# Patient Record
Sex: Female | Born: 1995 | Race: Black or African American | Hispanic: No | Marital: Single | Smoking: Current some day smoker
Health system: Southern US, Community
[De-identification: ages and names within clinical notes are randomized; demographics above are authoritative.]

## PROBLEM LIST (undated history)

## (undated) DIAGNOSIS — M549 Dorsalgia, unspecified: Secondary | ICD-10-CM

## (undated) HISTORY — PX: WISDOM TOOTH EXTRACTION: SHX21

---
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
## 2016-01-12 ENCOUNTER — Encounter (HOSPITAL_COMMUNITY): Payer: Self-pay | Admitting: Emergency Medicine

## 2016-01-12 ENCOUNTER — Emergency Department (HOSPITAL_COMMUNITY)
Admission: EM | Admit: 2016-01-12 | Discharge: 2016-01-13 | Disposition: A | Discharge status: Home / Self Care | Payer: Medicaid Other | Attending: Emergency Medicine | Admitting: Emergency Medicine

## 2016-01-12 DIAGNOSIS — F17200 Nicotine dependence, unspecified, uncomplicated: Secondary | ICD-10-CM | POA: Insufficient documentation

## 2016-01-12 DIAGNOSIS — K0889 Other specified disorders of teeth and supporting structures: Secondary | ICD-10-CM

## 2016-01-12 DIAGNOSIS — M545 Low back pain: Secondary | ICD-10-CM

## 2016-01-12 NOTE — ED Triage Notes (Signed)
Pt also c/o abscess to vaginal area.

## 2016-01-12 NOTE — ED Triage Notes (Signed)
Pt c/o back pain, rt hip pain and dental pain.

## 2016-01-13 MED ORDER — ACETAMINOPHEN 325 MG PO TABS
650.0000 mg | ORAL_TABLET | Freq: Once | ORAL | Status: AC
  Administered 2016-01-13: 650 mg via ORAL
  Filled 2016-01-13: qty 2

## 2016-01-13 MED ORDER — ONDANSETRON HCL 4 MG PO TABS
4.0000 mg | ORAL_TABLET | Freq: Once | ORAL | Status: AC
  Administered 2016-01-13: 4 mg via ORAL
  Filled 2016-01-13: qty 1

## 2016-01-13 MED ORDER — IBUPROFEN 600 MG PO TABS: 600.0000 mg | ORAL_TABLET | Freq: Four times a day (QID) | ORAL | 0 refills | Status: DC | PRN

## 2016-01-13 MED ORDER — METHOCARBAMOL 500 MG PO TABS: 500.0000 mg | ORAL_TABLET | Freq: Three times a day (TID) | ORAL | 0 refills | Status: DC

## 2016-01-13 MED ORDER — IBUPROFEN 800 MG PO TABS
800.0000 mg | ORAL_TABLET | Freq: Once | ORAL | Status: AC
  Administered 2016-01-13: 800 mg via ORAL
  Filled 2016-01-13: qty 1

## 2016-01-13 MED ORDER — AMOXICILLIN 500 MG PO CAPS: 500.0000 mg | ORAL_CAPSULE | Freq: Three times a day (TID) | ORAL | 0 refills | Status: DC

## 2016-01-13 MED ORDER — AMOXICILLIN 250 MG PO CAPS
500.0000 mg | ORAL_CAPSULE | Freq: Once | ORAL | Status: AC
  Administered 2016-01-13: 500 mg via ORAL
  Filled 2016-01-13: qty 2

## 2016-01-13 NOTE — Discharge Instructions (Signed)
Your vital signs within normal limits. Please use Amoxil 3 times daily. Please use ibuprofen 3 times daily to assist with pain of your tooth and your back. Use Robaxin for spasm type pain concerning your back. Robaxin may cause drowsiness, please do not drive, drink alcohol, operate machinery, or participate in activities requiring concentration when taking this medication. Please see Dr. Sudie Bailey as soon as possible for additional evaluation concerning your back. Please see your dentist as soon as possible concerning the left upper wisdom tooth.

## 2016-01-13 NOTE — ED Notes (Signed)
MD at bedside. 

## 2016-01-13 NOTE — ED Provider Notes (Signed)
[facility] Provider Note   CSN: [id number] Arrival date & time: 01/12/16  2309  First Provider Contact:  First MD Initiated Contact with Patient 01/13/16 0115        History   Chief Complaint Chief Complaint  Patient presents with  . Back Pain    HPI Brandy Chang is a 20 y.o. female.  Patient is an 20 year old female who presents to the emergency department with a complaint of back pain and tooth pain.  The patient states that over the last 3-4 days she's been having increasing problems with her lower back. She denies any recent fall or injury. She denies any excessive lifting pushing or pulling on. She denies any pain with urination. There's been no blood in her urine or blood in her stool. She states the pain is not related to her menstrual cycle. His been no fever, chills, or sweats on. She denies any unusual vaginal discharge or vaginal bleeding.  The patient also complains of tooth pain. She states she can feel a tooth coming through her gum on the left side. She has not been seen by Dr. Sudie Bailey to address any of these issues on. She presents at this time for assistance.      Past Medical History:  Diagnosis Date  . Asthma     There are no active problems to display for this patient.   History reviewed. No pertinent surgical history.  OB History    No data available       Home Medications    Prior to Admission medications   Medication Sig Start Date End Date Taking? Authorizing Provider  methocarbamol (ROBAXIN) 500 MG tablet Take 1 tablet (500 mg total) by mouth 2 (two) times daily. 08/08/15   Hope Orlene Och, NP  naproxen (NAPROSYN) 500 MG tablet Take 1 tablet (500 mg total) by mouth 2 (two) times daily. 08/08/15   Hope Orlene Och, NP    Family History History reviewed. No pertinent family history.  Social History Social History  Substance Use Topics  . Smoking status: Current Every Day Smoker  . Smokeless tobacco: Never Used  . Alcohol use No      Allergies   Review of patient's allergies indicates no known allergies.   Review of Systems Review of Systems  HENT: Positive for dental problem.   Musculoskeletal: Positive for back pain.  All other systems reviewed and are negative.    Physical Exam Updated Vital Signs BP 109/64   Pulse 98   Temp 98.6 F (37 C) (Oral)   Resp 17   Ht 5\' 7"  (1.702 m)   Wt 68.9 kg   LMP 12/13/2015   SpO2 100%   BMI 23.78 kg/m   Physical Exam  Constitutional: She is oriented to person, place, and time. She appears well-developed and well-nourished.  Non-toxic appearance.  HENT:  Head: Normocephalic.  Right Ear: Tympanic membrane and external ear normal.  Left Ear: Tympanic membrane and external ear normal.  The left upper wisdom tooth is erupting through the gum line area. There is no visible abscess. The uvula is in the midline. Airway is patent. There is no swelling under the tongue.  Eyes: EOM and lids are normal. Pupils are equal, round, and reactive to light.  Neck: Normal range of motion. Neck supple. Carotid bruit is not present.  Cardiovascular: Normal rate, regular rhythm, normal heart sounds, intact distal pulses and normal pulses.   Pulmonary/Chest: Breath sounds normal. No respiratory distress.  Abdominal: Soft. Bowel  sounds are normal. There is no tenderness. There is no guarding.  Musculoskeletal: Normal range of motion.       Lumbar back: She exhibits pain. She exhibits no deformity.       Back:  Lymphadenopathy:       Head (right side): No submandibular adenopathy present.       Head (left side): No submandibular adenopathy present.    She has no cervical adenopathy.  Neurological: She is alert and oriented to person, place, and time. She has normal strength. No cranial nerve deficit or sensory deficit.  Skin: Skin is warm and dry.  Psychiatric: She has a normal mood and affect. Her speech is normal.  Nursing note and vitals reviewed.    ED Treatments /  Results  Labs (all labs ordered are listed, but only abnormal results are displayed) Labs Reviewed - No data to display  EKG  EKG Interpretation None       Radiology No results found.  Procedures Procedures (including critical care time)  Medications Ordered in ED Medications  amoxicillin (AMOXIL) capsule 500 mg (not administered)  ibuprofen (ADVIL,MOTRIN) tablet 800 mg (not administered)  acetaminophen (TYLENOL) tablet 650 mg (not administered)  ondansetron (ZOFRAN) tablet 4 mg (not administered)     Initial Impression / Assessment and Plan / ED Course  I have reviewed the triage vital signs and the nursing notes.  Pertinent labs & imaging results that were available during my care of the patient were reviewed by me and considered in my medical decision making (see chart for details).  Clinical Course    **I have reviewed nursing notes, vital signs, and all appropriate lab and imaging results for this patient.*  Final Clinical Impressions(s) / ED Diagnoses  Vital signs reviewed. Patient has an erupting wisdom tooth on the left upper gum area. There is no evidence of abscess. No evidence for Ludwig's angina. The patient has some lower lumbar area pain. She denies any urinary symptoms. There's been no vaginal symptoms reported. No trauma reported. She denies any known pregnancy at this time. His been no recent operations or procedures. No gross neurologic deficits appreciated on examination at this time.  The patient will be treated with Robaxin and ibuprofen on for her back. Amoxil for her toothache. I've advised the patient to see Dr. Sudie Bailey for additional evaluation concerning her back. I've also advised her to see a dentist systems possible concerning the a rupturing wisdom tooth. The patient acknowledges understanding of this instruction.    Final diagnoses:  None    New Prescriptions New Prescriptions   No medications on file     Ivery Quale,  PA-C 01/13/16 0126    Devoria Albe, MD 01/13/16 [phone]

## 2016-01-14 ENCOUNTER — Encounter (HOSPITAL_COMMUNITY): Payer: Self-pay | Admitting: Emergency Medicine

## 2016-01-14 ENCOUNTER — Emergency Department (HOSPITAL_COMMUNITY)
Admission: EM | Admit: 2016-01-14 | Discharge: 2016-01-14 | Disposition: A | Discharge status: Home / Self Care | Payer: Medicaid Other | Attending: Emergency Medicine | Admitting: Emergency Medicine

## 2016-01-14 DIAGNOSIS — Z79899 Other long term (current) drug therapy: Secondary | ICD-10-CM | POA: Insufficient documentation

## 2016-01-14 DIAGNOSIS — Z791 Long term (current) use of non-steroidal anti-inflammatories (NSAID): Secondary | ICD-10-CM | POA: Diagnosis not present

## 2016-01-14 DIAGNOSIS — Z792 Long term (current) use of antibiotics: Secondary | ICD-10-CM | POA: Diagnosis not present

## 2016-01-14 DIAGNOSIS — F17200 Nicotine dependence, unspecified, uncomplicated: Secondary | ICD-10-CM | POA: Diagnosis not present

## 2016-01-14 DIAGNOSIS — J45909 Unspecified asthma, uncomplicated: Secondary | ICD-10-CM | POA: Diagnosis not present

## 2016-01-14 DIAGNOSIS — N764 Abscess of vulva: Secondary | ICD-10-CM

## 2016-01-14 DIAGNOSIS — L700 Acne vulgaris: Secondary | ICD-10-CM | POA: Diagnosis not present

## 2016-01-14 MED ORDER — LIDOCAINE HCL (PF) 1 % IJ SOLN
10.0000 mL | Freq: Once | INTRAMUSCULAR | Status: AC
  Administered 2016-01-14: 5 mL via INTRADERMAL
  Filled 2016-01-14: qty 10

## 2016-01-14 MED ORDER — TRAMADOL HCL 50 MG PO TABS: 50.0000 mg | ORAL_TABLET | Freq: Four times a day (QID) | ORAL | 0 refills | Status: DC | PRN

## 2016-01-14 MED ORDER — CHLORHEXIDINE GLUCONATE 4 % EX LIQD: Freq: Every day | CUTANEOUS | 0 refills | Status: DC | PRN

## 2016-01-14 MED ORDER — DOXYCYCLINE HYCLATE 100 MG PO CAPS
100.0000 mg | ORAL_CAPSULE | Freq: Two times a day (BID) | ORAL | 0 refills | Status: DC
Start: 2016-01-14 — End: 2016-02-15

## 2016-01-14 NOTE — ED Triage Notes (Signed)
Pt reports abscess that is draining to her R groin area. Drainage is purulent.

## 2016-01-14 NOTE — ED Provider Notes (Signed)
[facility] Provider Note   CSN: [id number] Arrival date & time: 01/14/16  1453  First Provider Contact:  First MD Initiated Contact with Patient 01/14/16 1545        History   Chief Complaint Chief Complaint  Patient presents with  . Abscess    HPI Brandy Chang is a 20 y.o. female who presents emergency Department with chief complaint of right labial abscess. Onset 3 days ago. She's has worsening pain and swelling of the right labia. She denies any vaginal symptoms such as foul odor or discharge. She's never had a problem like this before. It is currently draining some purulent discharge. She denies fevers, chills, bodyaches, or other signs of systemic infection.  HPI  Past Medical History:  Diagnosis Date  . Asthma     There are no active problems to display for this patient.   History reviewed. No pertinent surgical history.  OB History    No data available       Home Medications    Prior to Admission medications   Medication Sig Start Date End Date Taking? Authorizing Provider  amoxicillin (AMOXIL) 500 MG capsule Take 1 capsule (500 mg total) by mouth 3 (three) times daily. 01/13/16   Ivery Quale, PA-C  ibuprofen (ADVIL,MOTRIN) 600 MG tablet Take 1 tablet (600 mg total) by mouth every 6 (six) hours as needed. 01/13/16   Ivery Quale, PA-C  methocarbamol (ROBAXIN) 500 MG tablet Take 1 tablet (500 mg total) by mouth 3 (three) times daily. 01/13/16   Ivery Quale, PA-C  naproxen (NAPROSYN) 500 MG tablet Take 1 tablet (500 mg total) by mouth 2 (two) times daily. 08/08/15   Hope Orlene Och, NP    Family History Family History  Problem Relation Age of Onset  . Anemia Mother   . Diabetes Other   . Hypertension Other   . Stroke Other   . Osteoarthritis Other   . Sickle cell anemia Other     Social History Social History  Substance Use Topics  . Smoking status: Current Every Day Smoker  . Smokeless tobacco: Never Used  . Alcohol use No      Allergies   Review of patient's allergies indicates no known allergies.   Review of Systems Review of Systems  Ten systems reviewed and are negative for acute change, except as noted in the HPI.   Physical Exam Updated Vital Signs BP 125/75 (BP Location: Left Arm)   Pulse 100   Temp 99 F (37.2 C) (Oral)   Resp 18   Ht 5\' 7"  (1.702 m)   Wt 68.3 kg   LMP 12/13/2015   SpO2 98%   BMI 23.58 kg/m   Physical Exam Physical Exam  Nursing note and vitals reviewed. Constitutional: She is oriented to person, place, and time. She appears well-developed and well-nourished. No distress.  HENT:  Head: Normocephalic and atraumatic.  Eyes: Conjunctivae normal and EOM are normal. Pupils are equal, round, and reactive to light. No scleral icterus.  Neck: Normal range of motion.  Cardiovascular: Normal rate, regular rhythm and normal heart sounds.  Exam reveals no gallop and no friction rub.   No murmur heard. Pulmonary/Chest: Effort normal and breath sounds normal. No respiratory distress.  Abdominal: Soft. Bowel sounds are normal. She exhibits no distension and no mass. There is no tenderness. There is no guarding.  Neurological: She is alert and oriented to person, place, and time.  Skin: Skin is warm and dry. She is not diaphoretic.  Genitourinary: 3 cm fluctuant abscess of the right labia with about 2 cm of surrounding induration. Small amount of oozing purulent drainage. Multiple areas of open comedones in the pubic hairline. Mild surrounding right inguinal adenopathy    ED Treatments / Results  Labs (all labs ordered are listed, but only abnormal results are displayed) Labs Reviewed - No data to display  EKG  EKG Interpretation None       Radiology No results found.  Procedures .Marland KitchenIncision and Drainage Date/Time: 01/14/2016 4:53 PM Performed by: Arthor Captain Authorized by: Arthor Captain   Consent:    Consent obtained:  Verbal   Consent given by:   Patient   Risks discussed:  Bleeding, pain, incomplete drainage and infection   Alternatives discussed:  No treatment and observation Location:    Type:  Abscess   Size:  3 cm   Location:  Anogenital   Anogenital location:  Vulva Pre-procedure details:    Skin preparation:  Betadine Anesthesia (see MAR for exact dosages):    Anesthesia method:  Local infiltration   Local anesthetic:  Procaine 1% w/o epi Procedure type:    Complexity:  Simple Procedure details:    Needle aspiration: no     Incision types:  Single straight   Incision depth:  Dermal   Scalpel blade:  11   Wound management:  Probed and deloculated and irrigated with saline   Drainage:  Purulent   Drainage amount:  Moderate   Wound treatment:  Wound left open   Packing materials:  None Post-procedure details:    Patient tolerance of procedure:  Tolerated well, no immediate complications   (including critical care time)  Medications Ordered in ED Medications  lidocaine (PF) (XYLOCAINE) 1 % injection 10 mL (10 mLs Intradermal Given by Other 01/14/16 1534)     Initial Impression / Assessment and Plan / ED Course  I have reviewed the triage vital signs and the nursing notes.  Pertinent labs & imaging results that were available during my care of the patient were reviewed by me and considered in my medical decision making (see chart for details).  Clinical Course    Patient with skin abscess amenable to incision and drainage.  Abscess was not large enough to warrant packing or drain,  wound recheck in 2 days. Encouraged home warm soaks and flushing.  Mild signs of cellulitis is surrounding skin.   Discharge with doxycycline. Discussed return precautions. Follow up in 2 days. Final Clinical Impressions(s) / ED Diagnoses   Final diagnoses:  Labial abscess  Open comedone    New Prescriptions Discharge Medication List as of 01/14/2016  4:14 PM    START taking these medications   Details  chlorhexidine  (HIBICLENS) 4 % external liquid Apply topically daily as needed. Wash affected area daily., Starting Thu 01/14/2016, Print    doxycycline (VIBRAMYCIN) 100 MG capsule Take 1 capsule (100 mg total) by mouth 2 (two) times daily. One po bid x 7 days, Starting Thu 01/14/2016, Print    traMADol (ULTRAM) 50 MG tablet Take 1 tablet (50 mg total) by mouth every 6 (six) hours as needed., Starting Thu 01/14/2016, Print         Arthor Captain, PA-C 01/14/16 1655    Shaune Pollack, MD 01/14/16 2042

## 2016-02-05 ENCOUNTER — Encounter: Payer: Self-pay | Admitting: *Deleted

## 2016-02-05 ENCOUNTER — Encounter: Payer: Self-pay | Admitting: Obstetrics and Gynecology

## 2016-02-15 ENCOUNTER — Encounter: Payer: Self-pay | Admitting: Obstetrics and Gynecology

## 2016-02-15 ENCOUNTER — Ambulatory Visit (INDEPENDENT_AMBULATORY_CARE_PROVIDER_SITE_OTHER): Payer: Medicaid Other | Admitting: Obstetrics and Gynecology

## 2016-02-15 DIAGNOSIS — N764 Abscess of vulva: Secondary | ICD-10-CM

## 2016-02-15 NOTE — Progress Notes (Signed)
Patient ID: Brandy OhEssence E Tierney, female   DOB: [birth year], 20 y.o.   MRN: [id number]    [facility]  @DATE @            Patient name: Brandy Ohssence E Chang MRN [id number]  Date of birth: [birth year]  CC & HPI:   Chief Complaint  Patient presents with   Follow-up    went to [facility] with abcess     Brandy Chang is a 20 y.o. female presenting today for f/u of left labial abscess I&D in the ED on 01/14/16. Pt states the area has improved since drainage in the ED and states she has taken all medications as prescribed. She presents today to become established with the practice as she was told to f/u after being seen in the ER.   ROS:  Review of Systems  Genitourinary:       +healed labial abscess  on further dicussion: likel inguinal folliculitis/ comedone  Pertinent History Reviewed:   Reviewed  Medical         Past Medical History:  Diagnosis Date   Asthma                               Surgical Hx:   History reviewed. No pertinent surgical history. Medications: Reviewed & Updated - see associated section                      No current outpatient prescriptions on file.   Social History: Reviewed -  reports that she has been smoking Cigars.  She has smoked for the past 3.00 years. She has never used smokeless tobacco.  Objective Findings:  Vitals: Blood pressure 112/62, pulse 84, height 5' 5.5" (1.664 m), weight 148 lb 8 oz (67.4 kg), last menstrual period 02/15/2016.  Physical Examination: Discussion only patient refuses exam offer   Assessment & Plan:   A:  1. Encounter for f/u from ED 2. Pt reports left labial abscess is well healed 3. Birth control method: abstinence   P:  1. F/u PRN    By signing my name below, I, Doreatha MartinEva Mathews, attest that this documentation has been prepared under the direction and in the presence of Tilda BurrowJohn V Ferguson, MD. Electronically Signed: Doreatha MartinEva Mathews, ED Scribe. 02/15/16. 3:53 PM.  I personally performed the services  described in this documentation, which was SCRIBED in my presence. The recorded information has been reviewed and considered accurate. It has been edited as necessary during review. Tilda BurrowFERGUSON,JOHN V, MD

## 2016-03-18 ENCOUNTER — Encounter (HOSPITAL_COMMUNITY): Payer: Self-pay

## 2016-03-18 ENCOUNTER — Emergency Department (HOSPITAL_COMMUNITY)
Admission: EM | Admit: 2016-03-18 | Discharge: 2016-03-18 | Disposition: A | Discharge status: Home / Self Care | Payer: Medicaid Other | Attending: Emergency Medicine | Admitting: Emergency Medicine

## 2016-03-18 DIAGNOSIS — L739 Follicular disorder, unspecified: Secondary | ICD-10-CM | POA: Insufficient documentation

## 2016-03-18 DIAGNOSIS — L02214 Cutaneous abscess of groin: Secondary | ICD-10-CM | POA: Diagnosis not present

## 2016-03-18 DIAGNOSIS — F17290 Nicotine dependence, other tobacco product, uncomplicated: Secondary | ICD-10-CM | POA: Diagnosis not present

## 2016-03-18 DIAGNOSIS — L0291 Cutaneous abscess, unspecified: Secondary | ICD-10-CM

## 2016-03-18 DIAGNOSIS — J45909 Unspecified asthma, uncomplicated: Secondary | ICD-10-CM | POA: Insufficient documentation

## 2016-03-18 MED ORDER — TRAMADOL HCL 50 MG PO TABS: 50.0000 mg | ORAL_TABLET | Freq: Four times a day (QID) | ORAL | 0 refills | Status: DC | PRN

## 2016-03-18 MED ORDER — DOXYCYCLINE HYCLATE 100 MG PO CAPS: 100.0000 mg | ORAL_CAPSULE | Freq: Two times a day (BID) | ORAL | 0 refills | Status: DC

## 2016-03-18 MED ORDER — DOXYCYCLINE HYCLATE 100 MG PO TABS
100.0000 mg | ORAL_TABLET | Freq: Once | ORAL | Status: AC
  Administered 2016-03-18: 100 mg via ORAL
  Filled 2016-03-18: qty 1

## 2016-03-18 NOTE — ED Provider Notes (Signed)
[facility] Provider Note   CSN: [id number] Arrival date & time: 03/18/16  0221     History   Chief Complaint Chief Complaint  Patient presents with  . Abscess    HPI Brandy Chang is a 20 y.o. female.  Presents to the ER for evaluation of a tender swollen area in the groin. Symptoms began one or 2 days ago. Patient reports previous abscess in the area. Patient has not noticed any drainage, there is no fever.      Past Medical History:  Diagnosis Date  . Asthma     Patient Active Problem List   Diagnosis Date Noted  . Vulvar abscess 02/15/2016    History reviewed. No pertinent surgical history.  OB History    Gravida Para Term Preterm AB Living   0 0 0 0 0 0   SAB TAB Ectopic Multiple Live Births   0 0 0 0 0       Home Medications    Prior to Admission medications   Medication Sig Start Date End Date Taking? Authorizing Provider  doxycycline (VIBRAMYCIN) 100 MG capsule Take 1 capsule (100 mg total) by mouth 2 (two) times daily. 03/18/16   Gilda Crease, MD  traMADol (ULTRAM) 50 MG tablet Take 1 tablet (50 mg total) by mouth every 6 (six) hours as needed. 03/18/16   Gilda Crease, MD    Family History Family History  Problem Relation Age of Onset  . Anemia Mother   . Diabetes Other   . Hypertension Other   . Stroke Other   . Osteoarthritis Other   . Sickle cell anemia Other   . Heart attack Paternal Grandfather   . COPD Maternal Grandmother   . Diabetes Maternal Grandfather   . Sickle cell trait Maternal Grandfather     Social History Social History  Substance Use Topics  . Smoking status: Current Every Day Smoker    Years: 3.00    Types: Cigars  . Smokeless tobacco: Never Used     Comment: smokes 1-2 daily  . Alcohol use No     Allergies   Review of patient's allergies indicates no known allergies.   Review of Systems Review of Systems  Skin:       Tender swollen area right groin  All other systems  reviewed and are negative.    Physical Exam Updated Vital Signs BP 122/81 (BP Location: Left Arm)   Pulse 95   Temp 98.5 F (36.9 C) (Oral)   Resp 18   Ht 5\' 7"  (1.702 m)   Wt 150 lb (68 kg)   LMP 03/15/2016   SpO2 100%   BMI 23.49 kg/m   Physical Exam  Constitutional: She is oriented to person, place, and time. She appears well-developed and well-nourished. No distress.  HENT:  Head: Normocephalic and atraumatic.  Right Ear: Hearing normal.  Left Ear: Hearing normal.  Nose: Nose normal.  Mouth/Throat: Oropharynx is clear and moist and mucous membranes are normal.  Eyes: Conjunctivae and EOM are normal. Pupils are equal, round, and reactive to light.  Neck: Normal range of motion. Neck supple.  Cardiovascular: Regular rhythm, S1 normal and S2 normal.  Exam reveals no gallop and no friction rub.   No murmur heard. Pulmonary/Chest: Effort normal and breath sounds normal. No respiratory distress. She exhibits no tenderness.  Abdominal: Soft. Normal appearance and bowel sounds are normal. There is no hepatosplenomegaly. There is no tenderness. There is no rebound, no guarding, no tenderness  at McBurney's point and negative Murphy's sign. No hernia.  Musculoskeletal: Normal range of motion.  Neurological: She is alert and oriented to person, place, and time. She has normal strength. No cranial nerve deficit or sensory deficit. Coordination normal. GCS eye subscore is 4. GCS verbal subscore is 5. GCS motor subscore is 6.  Skin: Skin is warm, dry and intact. No rash noted. No cyanosis.  2 cm area of thickening, tenderness and slight induration without fluctuance, erythema or drainage right inguinal region  Psychiatric: She has a normal mood and affect. Her speech is normal and behavior is normal. Thought content normal.  Nursing note and vitals reviewed.    ED Treatments / Results  Labs (all labs ordered are listed, but only abnormal results are displayed) Labs Reviewed - No data  to display  EKG  EKG Interpretation None       Radiology No results found.  Procedures Procedures (including critical care time)  Medications Ordered in ED Medications - No data to display   Initial Impression / Assessment and Plan / ED Course  I have reviewed the triage vital signs and the nursing notes.  Pertinent labs & imaging results that were available during my care of the patient were reviewed by me and considered in my medical decision making (see chart for details).  Clinical Course   Patient presents to the ER for evaluation of skin abscess. Symptoms have been present for 1-2 days. Examination does reveal some mild induration and tenderness without obvious fluctuance. Recommended initiation of antibiotic therapy, warm soaks and follow-up in 1-2 days if area worsens for possible incision and drainage. Patient is in agreement with this plan.  Final Clinical Impressions(s) / ED Diagnoses   Final diagnoses:  Folliculitis  Abscess    New Prescriptions New Prescriptions   DOXYCYCLINE (VIBRAMYCIN) 100 MG CAPSULE    Take 1 capsule (100 mg total) by mouth 2 (two) times daily.   TRAMADOL (ULTRAM) 50 MG TABLET    Take 1 tablet (50 mg total) by mouth every 6 (six) hours as needed.     Gilda Creasehristopher J Pollina, MD 03/18/16 [phone]

## 2016-03-18 NOTE — ED Triage Notes (Signed)
Pt h;as swelling and pain to right groin area, states has had an abscess in same place previously.  No drainage

## 2016-03-29 ENCOUNTER — Ambulatory Visit (INDEPENDENT_AMBULATORY_CARE_PROVIDER_SITE_OTHER): Payer: Medicaid Other | Admitting: Adult Health

## 2016-03-29 ENCOUNTER — Encounter: Payer: Self-pay | Admitting: Adult Health

## 2016-03-29 VITALS — BP 92/74 | HR 84 | Ht 66.0 in | Wt 137.0 lb

## 2016-03-29 DIAGNOSIS — Z113 Encounter for screening for infections with a predominantly sexual mode of transmission: Secondary | ICD-10-CM

## 2016-03-29 DIAGNOSIS — N764 Abscess of vulva: Secondary | ICD-10-CM | POA: Diagnosis not present

## 2016-03-29 NOTE — Patient Instructions (Signed)
Call with recurrent abscess Bath dial antibacterial soap Do not shave, can clip  Follow up prn

## 2016-03-29 NOTE — Progress Notes (Signed)
Subjective:     Patient ID: Brandy Chang, female   DOB: March 09, 1996, 20 y.o.   MRN: [id number]  HPI [name] is a 20 year old black female in complaining of recurrent abscess, she says she gets them all the time.She is also requesting STD testing.  Review of Systems Recurrent abscess Reviewed past medical,surgical, social and family history. Reviewed medications and allergies.     Objective:   Physical Exam BP 92/74   Pulse 84   Ht 5\' 6"  (1.676 m)   Wt 137 lb (62.1 kg)   LMP 03/15/2016   BMI 22.11 kg/m  Skin warm and dry.Pelvic: external genitalia is normal in appearance, has resolving area right labia and also right butt cheek, vagina: scant discharge without odor,urethra has no lesions or masses noted, cervix:smooth and bulbous, uterus: normal size, shape and contour, non tender, no masses felt, adnexa: no masses or tenderness noted. Bladder is non tender and no masses felt.  GC/CHL obtained. Discussed that her hair is very thick and coarse, and curly, so do not shave, and use antibacterial soap.   PHQ 9 score 6, she says she is not depressed, she is happy, but has "rought times", encouraged to call if moods change, and she agrees. Face time 15 minutes with 50% counseling.   Assessment:     Vulvar abscess, resolving STD screening    Plan:     GC/CHL sent Check HIV and RPR   Call with recurrent abscess Bath with dial antibacterial soap Do not shave, can clip hair Follow up prn

## 2016-03-30 ENCOUNTER — Encounter: Payer: Self-pay | Admitting: Adult Health

## 2016-03-30 LAB — RPR: RPR: NONREACTIVE

## 2016-03-30 LAB — HIV ANTIBODY (ROUTINE TESTING W REFLEX): HIV SCREEN 4TH GENERATION: NONREACTIVE

## 2016-03-31 LAB — GC/CHLAMYDIA PROBE AMP
CHLAMYDIA, DNA PROBE: NEGATIVE
NEISSERIA GONORRHOEAE BY PCR: NEGATIVE

## 2016-06-04 ENCOUNTER — Emergency Department (HOSPITAL_COMMUNITY)
Admission: EM | Admit: 2016-06-04 | Discharge: 2016-06-04 | Disposition: A | Discharge status: Home / Self Care | Payer: Medicaid Other | Attending: Emergency Medicine | Admitting: Emergency Medicine

## 2016-06-04 ENCOUNTER — Emergency Department (HOSPITAL_COMMUNITY): Payer: Medicaid Other

## 2016-06-04 ENCOUNTER — Encounter (HOSPITAL_COMMUNITY): Payer: Self-pay

## 2016-06-04 DIAGNOSIS — Y929 Unspecified place or not applicable: Secondary | ICD-10-CM | POA: Diagnosis not present

## 2016-06-04 DIAGNOSIS — Y939 Activity, unspecified: Secondary | ICD-10-CM | POA: Insufficient documentation

## 2016-06-04 DIAGNOSIS — Y999 Unspecified external cause status: Secondary | ICD-10-CM | POA: Insufficient documentation

## 2016-06-04 DIAGNOSIS — J45909 Unspecified asthma, uncomplicated: Secondary | ICD-10-CM | POA: Insufficient documentation

## 2016-06-04 DIAGNOSIS — S99911A Unspecified injury of right ankle, initial encounter: Secondary | ICD-10-CM | POA: Diagnosis present

## 2016-06-04 DIAGNOSIS — M25571 Pain in right ankle and joints of right foot: Secondary | ICD-10-CM | POA: Insufficient documentation

## 2016-06-04 DIAGNOSIS — X58XXXA Exposure to other specified factors, initial encounter: Secondary | ICD-10-CM | POA: Insufficient documentation

## 2016-06-04 DIAGNOSIS — F17290 Nicotine dependence, other tobacco product, uncomplicated: Secondary | ICD-10-CM | POA: Diagnosis not present

## 2016-06-04 MED ORDER — IBUPROFEN 800 MG PO TABS: 800.0000 mg | ORAL_TABLET | Freq: Three times a day (TID) | ORAL | 0 refills | Status: DC

## 2016-06-04 NOTE — ED Triage Notes (Addendum)
Pt reports that she has recurrent right ankle pain that began hurting last night at work Reports feeling like ankle is going to give away. Slight swelling

## 2016-06-04 NOTE — Discharge Instructions (Signed)
Wear splint,  elevate foot and ankle.

## 2016-06-04 NOTE — ED Provider Notes (Signed)
[facility] Provider Note   CSN: [id number] Arrival date & time: 06/04/16  0905     History   Chief Complaint Chief Complaint  Patient presents with  . Ankle Pain    HPI Brandy Chang is a 20 y.o. female.  The history is provided by the patient. No language interpreter was used.  Ankle Pain   The incident occurred more than 1 week ago. There was no injury mechanism. The pain is present in the right ankle. The quality of the pain is described as aching. The pain is at a severity of 5/10. The pain is moderate. The pain has been constant since onset. Pertinent negatives include no numbness. She reports no foreign bodies present. She has tried nothing for the symptoms. The treatment provided no relief.   Pt reports she injured her ankle in the past.  Pt reports she has had problems on and off for several years. Pt complains of swelling and pain with standing.  Pt is standing a lot at her job.   Past Medical History:  Diagnosis Date  . Asthma     Patient Active Problem List   Diagnosis Date Noted  . Vulvar abscess 02/15/2016    History reviewed. No pertinent surgical history.  OB History    Gravida Para Term Preterm AB Living   0 0 0 0 0 0   SAB TAB Ectopic Multiple Live Births   0 0 0 0 0       Home Medications    Prior to Admission medications   Medication Sig Start Date End Date Taking? Authorizing Provider  doxycycline (VIBRAMYCIN) 100 MG capsule Take 1 capsule (100 mg total) by mouth 2 (two) times daily. Patient not taking: Reported on 03/29/2016 03/18/16   Gilda Creasehristopher J Pollina, MD    Family History Family History  Problem Relation Age of Onset  . Anemia Mother   . Diabetes Other   . Hypertension Other   . Stroke Other   . Osteoarthritis Other   . Sickle cell anemia Other   . Heart attack Paternal Grandfather   . COPD Maternal Grandmother   . Diabetes Maternal Grandfather   . Sickle cell trait Maternal Grandfather     Social  History Social History  Substance Use Topics  . Smoking status: Current Every Day Smoker    Years: 3.00    Types: Cigars  . Smokeless tobacco: Never Used     Comment: smokes 1-2 daily  . Alcohol use No     Allergies   Patient has no known allergies.   Review of Systems Review of Systems  Neurological: Negative for numbness.  All other systems reviewed and are negative.    Physical Exam Updated Vital Signs BP 123/67 (BP Location: Right Arm)   Pulse 89   Temp 98.4 F (36.9 C) (Oral)   Resp 16   Ht 5\' 8"  (1.727 m)   Wt 62.1 kg   LMP 05/19/2016   SpO2 100%   BMI 20.83 kg/m   Physical Exam  Constitutional: She appears well-developed and well-nourished.  Musculoskeletal: She exhibits tenderness. She exhibits no deformity.  Tender lateral malleolus,  Pain with movement  Skin: Skin is warm.  Psychiatric: She has a normal mood and affect.     ED Treatments / Results  Labs (all labs ordered are listed, but only abnormal results are displayed) Labs Reviewed - No data to display  EKG  EKG Interpretation None       Radiology Dg  Ankle Complete Right  Result Date: 06/04/2016 CLINICAL DATA:  Right ankle pain with swelling. EXAM: RIGHT ANKLE - COMPLETE 3+ VIEW COMPARISON:  07/07/2011 FINDINGS: There is no evidence of fracture, dislocation, or joint effusion. There is no evidence of arthropathy or other focal bone abnormality. Soft tissues are unremarkable. IMPRESSION: Negative. Electronically Signed   By: Charlett NoseKevin  Dover M.D.   On: 06/04/2016 09:55    Procedures Procedures (including critical care time)  Medications Ordered in ED Medications - No data to display   Initial Impression / Assessment and Plan / ED Course  I have reviewed the triage vital signs and the nursing notes.  Pertinent labs & imaging results that were available during my care of the patient were reviewed by me and considered in my medical decision making (see chart for details).  Clinical  Course     Pt placed in aso. I advised follow up with Dr. Romeo AppleHarrison if any problems.  Final Clinical Impressions(s) / ED Diagnoses   Final diagnoses:  Right ankle pain, unspecified chronicity    New Prescriptions New Prescriptions   IBUPROFEN (ADVIL,MOTRIN) 800 MG TABLET    Take 1 tablet (800 mg total) by mouth 3 (three) times daily.    An After Visit Summary was printed and given to the patient. Elson AreasLeslie K Sawyer Mentzer, PA-C 06/04/16 1010    [address]Bibiana Gillean K MooresvilleSofia, [place] JerseyPA-C 06/04/16 1012    Heide Scaleshristopher J Tegeler, MD 06/04/16 2113

## 2016-10-27 ENCOUNTER — Ambulatory Visit: Payer: Medicaid Other | Admitting: Adult Health

## 2016-10-28 ENCOUNTER — Ambulatory Visit: Payer: Medicaid Other | Admitting: Adult Health

## 2016-11-01 ENCOUNTER — Ambulatory Visit: Payer: Medicaid Other | Admitting: Adult Health

## 2016-11-05 ENCOUNTER — Emergency Department (HOSPITAL_COMMUNITY)
Admission: EM | Admit: 2016-11-05 | Discharge: 2016-11-05 | Disposition: A | Discharge status: Home / Self Care | Payer: Medicaid Other | Attending: Emergency Medicine | Admitting: Emergency Medicine

## 2016-11-05 ENCOUNTER — Encounter (HOSPITAL_COMMUNITY): Payer: Self-pay | Admitting: Emergency Medicine

## 2016-11-05 DIAGNOSIS — Y990 Civilian activity done for income or pay: Secondary | ICD-10-CM | POA: Insufficient documentation

## 2016-11-05 DIAGNOSIS — Y92511 Restaurant or cafe as the place of occurrence of the external cause: Secondary | ICD-10-CM | POA: Insufficient documentation

## 2016-11-05 DIAGNOSIS — F17210 Nicotine dependence, cigarettes, uncomplicated: Secondary | ICD-10-CM | POA: Insufficient documentation

## 2016-11-05 DIAGNOSIS — J45909 Unspecified asthma, uncomplicated: Secondary | ICD-10-CM | POA: Insufficient documentation

## 2016-11-05 DIAGNOSIS — X500XXA Overexertion from strenuous movement or load, initial encounter: Secondary | ICD-10-CM | POA: Insufficient documentation

## 2016-11-05 DIAGNOSIS — S39012A Strain of muscle, fascia and tendon of lower back, initial encounter: Secondary | ICD-10-CM | POA: Insufficient documentation

## 2016-11-05 DIAGNOSIS — Y9389 Activity, other specified: Secondary | ICD-10-CM | POA: Insufficient documentation

## 2016-11-05 MED ORDER — IBUPROFEN 800 MG PO TABS
800.0000 mg | ORAL_TABLET | Freq: Once | ORAL | Status: AC
Start: 2016-11-05 — End: 2016-11-05
  Administered 2016-11-05: 800 mg via ORAL
  Filled 2016-11-05: qty 1

## 2016-11-05 MED ORDER — CYCLOBENZAPRINE HCL 10 MG PO TABS: 10.0000 mg | ORAL_TABLET | Freq: Three times a day (TID) | ORAL | 0 refills | Status: DC

## 2016-11-05 MED ORDER — IBUPROFEN 600 MG PO TABS: 600.0000 mg | ORAL_TABLET | Freq: Four times a day (QID) | ORAL | 0 refills | Status: DC

## 2016-11-05 MED ORDER — ONDANSETRON HCL 4 MG PO TABS
4.0000 mg | ORAL_TABLET | Freq: Once | ORAL | Status: AC
  Administered 2016-11-05: 4 mg via ORAL
  Filled 2016-11-05: qty 1

## 2016-11-05 MED ORDER — TRAMADOL HCL 50 MG PO TABS
50.0000 mg | ORAL_TABLET | Freq: Once | ORAL | Status: AC
  Administered 2016-11-05: 50 mg via ORAL
  Filled 2016-11-05: qty 1

## 2016-11-05 MED ORDER — DIAZEPAM 5 MG PO TABS
5.0000 mg | ORAL_TABLET | Freq: Once | ORAL | Status: AC
  Administered 2016-11-05: 5 mg via ORAL
  Filled 2016-11-05: qty 1

## 2016-11-05 NOTE — ED Triage Notes (Signed)
Awakened with back ache to low back  Has taken no OTC meds  Dr Sudie BaileyKnowlton is PCP

## 2016-11-05 NOTE — Discharge Instructions (Signed)
Your vital signs within normal limits. Your examination suggest muscle strain involving the lower back or lumbar region. There no gross neurologic deficits appreciated on your examination at this time. Please rest your back over the next few days. Use a heating pad when sitting or when lying down at rest. Or use warm tub soaks with Epsom salt for 15 minutes each 2 times daily. Use ibuprofen with breakfast, lunch, dinner, and at bedtime. Use Flexeril 3 times daily for spasm pain. This medication may cause drowsiness, please do not drive, operate machinery, drink alcohol, or dissipated activities requiring concentration when taking this medication. Please see Dr. Sudie BaileyKnowlton for additional evaluation and management if not improving.

## 2016-11-05 NOTE — ED Provider Notes (Signed)
[facility] Provider Note   CSN: [id number] Arrival date & time: 11/05/16  1411     History   Chief Complaint Chief Complaint  Patient presents with  . Back Pain    since this am    HPI Lateisha E Chang is a 21 y.o. female.  Patient is a 21 year old female who presents to the emergency department with complaint of lower back pain.  The patient states that she has problems with her back every now and then, but this pain is more intense and longer lasting longer than previous episodes. 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 She states that this morning when she got up she could hardly get out of bed. She has pain when she takes a step in her lower back. She's not had any problems with loss of functional bowel or bladder function. She has not had any injury to her back. She's not had any previous operations or procedures. She has tried rest, but this has not been helpful.      Past Medical History:  Diagnosis Date  . Asthma     Patient Active Problem List   Diagnosis Date Noted  . Vulvar abscess 02/15/2016    History reviewed. No pertinent surgical history.  OB History    Gravida Para Term Preterm AB Living   0 0 0 0 0 0   SAB TAB Ectopic Multiple Live Births   0 0 0 0 0       Home Medications    Prior to Admission medications   Medication Sig Start Date End Date Taking? Authorizing Provider  doxycycline (VIBRAMYCIN) 100 MG capsule Take 1 capsule (100 mg total) by mouth 2 (two) times daily. Patient not taking: Reported on 03/29/2016 03/18/16   Gilda Crease, MD  ibuprofen (ADVIL,MOTRIN) 800 MG tablet Take 1 tablet (800 mg total) by mouth 3 (three) times daily. 06/04/16   Elson Areas, PA-C    Family History Family History  Problem Relation Age of Onset  . Anemia Mother   . Diabetes Other   . Hypertension Other   . Stroke Other   . Osteoarthritis Other   . Sickle cell anemia Other   . Heart attack  Paternal Grandfather   . COPD Maternal Grandmother   . Diabetes Maternal Grandfather   . Sickle cell trait Maternal Grandfather     Social History Social History  Substance Use Topics  . Smoking status: Current Every Day Smoker    Packs/day: 0.25    Years: 3.00    Types: Cigars  . Smokeless tobacco: Never Used     Comment: smokes 1-2 daily  . Alcohol use No     Allergies   Patient has no known allergies.   Review of Systems Review of Systems  Constitutional: Negative for activity change.       All ROS Neg except as noted in HPI  HENT: Negative for nosebleeds.   Eyes: Negative for photophobia and discharge.  Respiratory: Negative for cough, shortness of breath and wheezing.   Cardiovascular: Negative for chest pain and palpitations.  Gastrointestinal: Negative for abdominal pain and blood in stool.  Genitourinary: Negative for dysuria, frequency and hematuria.  Musculoskeletal: Positive for back pain. Negative for arthralgias and neck pain.  Skin: Negative.   Neurological: Negative for dizziness, seizures and speech difficulty.  Psychiatric/Behavioral: Negative for confusion and hallucinations.     Physical Exam Updated Vital Signs BP (!) 97/54 (BP Location:  Right Arm)   Pulse 86   Temp 99.8 F (37.7 C) (Oral)   Resp 20   Ht 5\' 8"  (1.727 m)   Wt 137 lb (62.1 kg)   LMP 10/18/2016   SpO2 99%   BMI 20.83 kg/m   Physical Exam  Constitutional: She is oriented to person, place, and time. She appears well-developed and well-nourished.  Non-toxic appearance.  HENT:  Head: Normocephalic.  Right Ear: Tympanic membrane and external ear normal.  Left Ear: Tympanic membrane and external ear normal.  Eyes: EOM and lids are normal. Pupils are equal, round, and reactive to light.  Neck: Normal range of motion. Neck supple. Carotid bruit is not present.  Cardiovascular: Normal rate, regular rhythm, normal heart sounds, intact distal pulses and normal pulses.     Pulmonary/Chest: Breath sounds normal. No respiratory distress.  Abdominal: Soft. Bowel sounds are normal. There is no tenderness. There is no guarding.  Musculoskeletal: She exhibits tenderness.       Lumbar back: She exhibits decreased range of motion and spasm.  Lymphadenopathy:       Head (right side): No submandibular adenopathy present.       Head (left side): No submandibular adenopathy present.    She has no cervical adenopathy.  Neurological: She is alert and oriented to person, place, and time. She has normal strength. No cranial nerve deficit or sensory deficit.  Skin: Skin is warm and dry.  Psychiatric: She has a normal mood and affect. Her speech is normal.  Nursing note and vitals reviewed.    ED Treatments / Results  Labs (all labs ordered are listed, but only abnormal results are displayed) Labs Reviewed - No data to display  EKG  EKG Interpretation None       Radiology No results found.  Procedures Procedures (including critical care time)  Medications Ordered in ED Medications - No data to display   Initial Impression / Assessment and Plan / ED Course  I have reviewed the triage vital signs and the nursing notes.  Pertinent labs & imaging results that were available during my care of the patient were reviewed by me and considered in my medical decision making (see chart for details).       Final Clinical Impressions(s) / ED Diagnoses MDM Temperature is 99.8, otherwise the vital signs within normal limits. The patient states that she's been having some runny nose and scratchy throat recently.  There no gross neurologic deficits appreciated on examination. The examination favors muscle strain. I've asked the patient to use a heating pad to her back, or to use warm tub soaks. I have also prescribed the patient a muscle relaxer and she will use an anti-inflammatory pain medication. The patient will be taken out of work over the next 2 working shifts.     Final diagnoses:  Strain of lumbar region, initial encounter    New Prescriptions New Prescriptions   CYCLOBENZAPRINE (FLEXERIL) 10 MG TABLET    Take 1 tablet (10 mg total) by mouth 3 (three) times daily.   IBUPROFEN (ADVIL,MOTRIN) 600 MG TABLET    Take 1 tablet (600 mg total) by mouth 4 (four) times daily.     Ivery QualeBryant, Journee Kohen, PA-C 11/05/16 1608    Vanetta MuldersZackowski, Scott, MD 11/06/16 [phone]

## 2016-11-05 NOTE — ED Notes (Signed)
Pt works at [employer] and reports that she lifts boxes in excess of 40 lbs- awakened this am with back pain

## 2016-11-16 ENCOUNTER — Ambulatory Visit: Payer: Medicaid Other | Admitting: Advanced Practice Midwife

## 2016-11-23 ENCOUNTER — Encounter: Payer: Self-pay | Admitting: Advanced Practice Midwife

## 2016-11-23 ENCOUNTER — Ambulatory Visit (INDEPENDENT_AMBULATORY_CARE_PROVIDER_SITE_OTHER): Payer: Medicaid Other | Admitting: Advanced Practice Midwife

## 2016-11-23 VITALS — BP 118/62 | HR 98 | Wt 146.0 lb

## 2016-11-23 DIAGNOSIS — Z113 Encounter for screening for infections with a predominantly sexual mode of transmission: Secondary | ICD-10-CM

## 2016-11-23 NOTE — Progress Notes (Signed)
[facility] Visit  Patient name: Brandy Chang MRN [id number]  Date of birth: 1995-11-29  CC & HPI:  Brandy Chang is a 21 y.o. African American female presenting today for STD screening.  She may have heard someone she slept with may have a STD, "just wants to be safe".  Declines bloodwork.  No symptoms  Pertinent History Reviewed:  Medical & Surgical Hx:   Past Medical History:  Diagnosis Date  . Asthma    History reviewed. No pertinent surgical history. Family History  Problem Relation Age of Onset  . Anemia Mother   . Diabetes Other   . Hypertension Other   . Stroke Other   . Osteoarthritis Other   . Sickle cell anemia Other   . Heart attack Paternal Grandfather   . COPD Maternal Grandmother   . Diabetes Maternal Grandfather   . Sickle cell trait Maternal Grandfather     Current Outpatient Prescriptions:  .  cyclobenzaprine (FLEXERIL) 10 MG tablet, Take 1 tablet (10 mg total) by mouth 3 (three) times daily. (Patient not taking: Reported on 11/23/2016), Disp: 20 tablet, Rfl: 0 .  ibuprofen (ADVIL,MOTRIN) 600 MG tablet, Take 1 tablet (600 mg total) by mouth 4 (four) times daily. (Patient not taking: Reported on 11/23/2016), Disp: 30 tablet, Rfl: 0 Social History: Reviewed -  reports that she has been smoking Cigars.  She has a 0.75 pack-year smoking history. She has never used smokeless tobacco.  Review of Systems:   Constitutional: Negative for fever and chills Eyes: Negative for visual disturbances Respiratory: Negative for shortness of breath, dyspnea Cardiovascular: Negative for chest pain or palpitations  Gastrointestinal: Negative for vomiting, diarrhea and constipation; no abdominal pain Genitourinary: Negative for dysuria and urgency, vaginal irritation or itching Musculoskeletal: Negative for back pain, joint pain, myalgias  Neurological: Negative for dizziness and headaches    Objective Findings:    Physical Examination: General appearance -  well appearing, and in no distress Mental status - alert, oriented to person, place, and time Chest:  Normal respiratory effort Heart - normal rate and regular rhythm Abdomen:  Soft, nontender Pelvic: declined by pt Musculoskeletal:  Normal range of motion without pain Extremities:  No edema    No results found for this or any previous visit (from the past 24 hour(s)).    Assessment & Plan:  A:   STD screen P:  GC/CHL/Trich   Return for If you have any problems.  CRESENZO-DISHMAN,Amillion Scobee CNM 11/23/2016 2:11 PM

## 2016-11-25 ENCOUNTER — Encounter (HOSPITAL_COMMUNITY): Payer: Self-pay

## 2016-11-25 ENCOUNTER — Emergency Department (HOSPITAL_COMMUNITY): Payer: Self-pay

## 2016-11-25 ENCOUNTER — Emergency Department (HOSPITAL_COMMUNITY)
Admission: EM | Admit: 2016-11-25 | Discharge: 2016-11-26 | Disposition: A | Discharge status: Home / Self Care | Payer: Self-pay | Attending: Emergency Medicine | Admitting: Emergency Medicine

## 2016-11-25 DIAGNOSIS — J45909 Unspecified asthma, uncomplicated: Secondary | ICD-10-CM | POA: Insufficient documentation

## 2016-11-25 DIAGNOSIS — F17290 Nicotine dependence, other tobacco product, uncomplicated: Secondary | ICD-10-CM | POA: Insufficient documentation

## 2016-11-25 DIAGNOSIS — X501XXA Overexertion from prolonged static or awkward postures, initial encounter: Secondary | ICD-10-CM | POA: Insufficient documentation

## 2016-11-25 DIAGNOSIS — Y929 Unspecified place or not applicable: Secondary | ICD-10-CM | POA: Insufficient documentation

## 2016-11-25 DIAGNOSIS — Y9389 Activity, other specified: Secondary | ICD-10-CM | POA: Insufficient documentation

## 2016-11-25 DIAGNOSIS — S39012A Strain of muscle, fascia and tendon of lower back, initial encounter: Secondary | ICD-10-CM | POA: Insufficient documentation

## 2016-11-25 DIAGNOSIS — Y999 Unspecified external cause status: Secondary | ICD-10-CM | POA: Insufficient documentation

## 2016-11-25 LAB — GC/CHLAMYDIA PROBE AMP
Chlamydia trachomatis, NAA: NEGATIVE
Neisseria gonorrhoeae by PCR: NEGATIVE

## 2016-11-25 LAB — POC URINE PREG, ED: PREG TEST UR: NEGATIVE

## 2016-11-25 LAB — TRICHOMONAS VAGINALIS, PROBE AMP: TRICH VAG BY NAA: NEGATIVE

## 2016-11-25 MED ORDER — NAPROXEN 250 MG PO TABS
500.0000 mg | ORAL_TABLET | Freq: Once | ORAL | Status: AC
  Administered 2016-11-25: 500 mg via ORAL
  Filled 2016-11-25: qty 2

## 2016-11-25 NOTE — ED Triage Notes (Signed)
Lower back strain after moving a fish tank

## 2016-11-26 MED ORDER — TRAMADOL HCL 50 MG PO TABS
50.0000 mg | ORAL_TABLET | Freq: Once | ORAL | Status: AC
  Administered 2016-11-26: 50 mg via ORAL
  Filled 2016-11-26: qty 1

## 2016-11-26 MED ORDER — TRAMADOL HCL 50 MG PO TABS: 50.0000 mg | ORAL_TABLET | Freq: Four times a day (QID) | ORAL | 0 refills | Status: DC | PRN
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
## 2016-11-26 NOTE — Discharge Instructions (Signed)
Do not drive within 4 hours of taking tramadol as this may make you drowsy.  Avoid lifting,  Bending,  Twisting or any other activity that worsens your pain over the next week.  Apply a heating pad to your back for 20 minutes several times daily as discussed.  You should get rechecked if your symptoms are not better over the next 5 days,  Or you develop increased pain,  Weakness in your leg(s) or loss of bladder or bowel function - these are symptoms of a worse injury.

## 2016-12-01 ENCOUNTER — Other Ambulatory Visit: Payer: Medicaid Other | Admitting: Adult Health

## 2016-12-01 ENCOUNTER — Ambulatory Visit: Payer: Medicaid Other | Admitting: Adult Health

## 2016-12-05 ENCOUNTER — Ambulatory Visit: Payer: Medicaid Other | Admitting: Adult Health

## 2017-05-06 ENCOUNTER — Encounter (HOSPITAL_COMMUNITY): Payer: Self-pay | Admitting: *Deleted

## 2017-05-06 ENCOUNTER — Other Ambulatory Visit: Payer: Self-pay

## 2017-05-06 ENCOUNTER — Emergency Department (HOSPITAL_COMMUNITY)
Admission: EM | Admit: 2017-05-06 | Discharge: 2017-05-06 | Disposition: A | Discharge status: Home / Self Care | Payer: Self-pay | Attending: Emergency Medicine | Admitting: Emergency Medicine

## 2017-05-06 DIAGNOSIS — J45909 Unspecified asthma, uncomplicated: Secondary | ICD-10-CM | POA: Insufficient documentation

## 2017-05-06 DIAGNOSIS — N764 Abscess of vulva: Secondary | ICD-10-CM | POA: Insufficient documentation

## 2017-05-06 DIAGNOSIS — F17290 Nicotine dependence, other tobacco product, uncomplicated: Secondary | ICD-10-CM | POA: Insufficient documentation

## 2017-05-06 MED ORDER — DOXYCYCLINE HYCLATE 100 MG PO TABS
100.0000 mg | ORAL_TABLET | Freq: Once | ORAL | Status: AC
  Administered 2017-05-06: 100 mg via ORAL
  Filled 2017-05-06: qty 1

## 2017-05-06 MED ORDER — POVIDONE-IODINE 10 % EX SOLN
CUTANEOUS | Status: AC
  Filled 2017-05-06: qty 15

## 2017-05-06 MED ORDER — LIDOCAINE-EPINEPHRINE (PF) 2 %-1:200000 IJ SOLN
INTRAMUSCULAR | Status: AC
  Filled 2017-05-06: qty 20

## 2017-05-06 MED ORDER — LIDOCAINE-EPINEPHRINE 2 %-1:100000 IJ SOLN
20.0000 mL | Freq: Once | INTRAMUSCULAR | Status: DC
  Filled 2017-05-06: qty 20

## 2017-05-06 MED ORDER — DOXYCYCLINE HYCLATE 100 MG PO CAPS: 100.0000 mg | ORAL_CAPSULE | Freq: Two times a day (BID) | ORAL | 0 refills | Status: DC

## 2017-05-06 MED ORDER — HYDROCODONE-ACETAMINOPHEN 5-325 MG PO TABS: 1.0000 | ORAL_TABLET | Freq: Four times a day (QID) | ORAL | 0 refills | Status: DC | PRN

## 2017-05-06 MED ORDER — ONDANSETRON 8 MG PO TBDP: ORAL_TABLET | ORAL | 0 refills | Status: DC

## 2017-05-06 MED ORDER — LIDOCAINE-PRILOCAINE 2.5-2.5 % EX CREA
TOPICAL_CREAM | Freq: Once | CUTANEOUS | Status: AC
  Administered 2017-05-06: 1 via TOPICAL
  Filled 2017-05-06: qty 5

## 2017-05-06 NOTE — ED Triage Notes (Signed)
Pt c/o abscess to left leg for the last couple of days

## 2017-05-06 NOTE — ED Provider Notes (Signed)
[facility]ERGENCY DEPARTMENT Provider Note   CSN: [id number] Arrival date & time: 05/06/17  [id number]     History   Chief Complaint Chief Complaint  Patient presents with  . Abscess    HPI Brandy Chang is a 21 y.o. female.  The history is provided by the patient.  Abscess  Location:  Pelvis Pelvic abscess location:  Vulva Abscess quality: fluctuance, induration, painful and redness   Duration:  5 days Progression:  Worsening Pain details:    Quality:  Dull   Severity:  Moderate   Timing:  Constant   Progression:  Worsening Chronicity:  New Context: not diabetes   Relieved by:  Nothing Exacerbated by: palpation. Associated symptoms: no fever and no vomiting     Past Medical History:  Diagnosis Date  . Asthma     Patient Active Problem List   Diagnosis Date Noted  . Vulvar abscess 02/15/2016    History reviewed. No pertinent surgical history.  OB History    Gravida Para Term Preterm AB Living   0 0 0 0 0 0   SAB TAB Ectopic Multiple Live Births   0 0 0 0 0       Home Medications    Prior to Admission medications   Medication Sig Start Date End Date Taking? Authorizing Provider  cyclobenzaprine (FLEXERIL) 10 MG tablet Take 1 tablet (10 mg total) by mouth 3 (three) times daily. Patient not taking: Reported on 11/23/2016 11/05/16   Ivery QualeBryant, Hobson, PA-C  ibuprofen (ADVIL,MOTRIN) 600 MG tablet Take 1 tablet (600 mg total) by mouth 4 (four) times daily. Patient not taking: Reported on 11/23/2016 11/05/16   Ivery QualeBryant, Hobson, PA-C  traMADol (ULTRAM) 50 MG tablet Take 1 tablet (50 mg total) by mouth every 6 (six) hours as needed. 11/26/16   Burgess AmorIdol, Julie, PA-C    Family History Family History  Problem Relation Age of Onset  . Anemia Mother   . Diabetes Other   . Hypertension Other   . Stroke Other   . Osteoarthritis Other   . Sickle cell anemia Other   . Heart attack Paternal Grandfather   . COPD Maternal Grandmother   . Diabetes Maternal Grandfather   .  Sickle cell trait Maternal Grandfather     Social History Social History   Tobacco Use  . Smoking status: Current Every Day Smoker    Packs/day: 0.25    Years: 3.00    Pack years: 0.75    Types: Cigars  . Smokeless tobacco: Never Used  . Tobacco comment: smokes 1-2 daily  Substance Use Topics  . Alcohol use: No  . Drug use: No    Comment: occ     Allergies   Patient has no known allergies.   Review of Systems Review of Systems  Constitutional: Negative for fever.  Gastrointestinal: Negative for vomiting.  Skin: Positive for wound.  All other systems reviewed and are negative.    Physical Exam Updated Vital Signs BP 112/62 (BP Location: Left Arm)   Pulse (!) 103   Temp 98.1 F (36.7 C) (Oral)   Resp 18   Ht 1.727 m (5\' 8" )   Wt 66.2 kg (146 lb)   LMP 05/01/2017   SpO2 98%   BMI 22.20 kg/m   Physical Exam CONSTITUTIONAL: Well developed/well nourished HEAD: Normocephalic/atraumatic ENMT: Mucous membranes moist NECK: supple no meningeal signs SPINE/BACK:entire spine nontender CV: S1/S2 noted, no murmurs/rubs/gallops noted LUNGS: Lungs are clear to auscultation bilaterally, no apparent distress ABDOMEN: soft,  nontender, no rebound or guarding, bowel sounds noted throughout abdomen GU:no cva tenderness Left vulvar abscess/induration, no crepitus/drainage.  Nurse present for exam NEURO: Pt is awake/alert/appropriate, moves all extremitiesx4. EXTREMITIES:   full ROM SKIN: warm, color normal PSYCH: no abnormalities of mood noted, alert and oriented to situation   ED Treatments / Results  Labs (all labs ordered are listed, but only abnormal results are displayed) Labs Reviewed - No data to display  EKG  EKG Interpretation None       Radiology No results found.  Procedures .Marland Kitchen.Incision and Drainage Date/Time: 05/06/2017 3:59 AM Performed by: Zadie RhineWickline, Juwan Vences, MD Authorized by: Zadie RhineWickline, Jaspreet Hollings, MD   Consent:    Consent obtained:  Verbal    Consent given by:  Patient   Risks discussed:  Bleeding and pain   Alternatives discussed:  No treatment Location:    Type:  Abscess   Location:  Anogenital   Anogenital location:  Vulva Anesthesia (see MAR for exact dosages):    Anesthesia method:  Topical application and local infiltration   Topical anesthetic:  EMLA cream   Local anesthetic:  Lidocaine 2% WITH epi Procedure type:    Complexity:  Complex Procedure details:    Needle aspiration: no     Incision types:  Stab incision   Scalpel blade:  11   Wound management:  Probed and deloculated   Drainage:  Purulent   Drainage amount:  Moderate   Wound treatment:  Wound left open   Packing materials:  None Post-procedure details:    Patient tolerance of procedure:  Tolerated well, no immediate complications Comments:     Nurse Ginger present for entire exam   (including critical care time)  Medications Ordered in ED Medications  lidocaine-EPINEPHrine (XYLOCAINE W/EPI) 2 %-1:100000 (with pres) injection 20 mL (not administered)  lidocaine-EPINEPHrine (XYLOCAINE W/EPI) 2 %-1:200000 (PF) injection (not administered)  povidone-iodine (BETADINE) 10 % external solution (not administered)  doxycycline (VIBRA-TABS) tablet 100 mg (100 mg Oral Given 05/06/17 0241)  lidocaine-prilocaine (EMLA) cream (1 application Topical Given 05/06/17 0241)     Initial Impression / Assessment and Plan / ED Course  I have reviewed the triage vital signs and the nursing notes.       4:00 AM Pt with abscess in lower vulvar region No signs of vaginal fistula No crepitus noted Pt is nontoxic in appearance She tolerated I&D well   Final Clinical Impressions(s) / ED Diagnoses   Final diagnoses:  Vulvar abscess    ED Discharge Orders        Ordered    doxycycline (VIBRAMYCIN) 100 MG capsule  2 times daily     05/06/17 0428    ondansetron (ZOFRAN ODT) 8 MG disintegrating tablet     05/06/17 0428    HYDROcodone-acetaminophen  (NORCO/VICODIN) 5-325 MG tablet  Every 6 hours PRN     05/06/17 0428       Zadie RhineWickline, Derrich Gaby, MD 05/06/17 [phone]

## 2017-12-04 ENCOUNTER — Other Ambulatory Visit: Payer: Self-pay

## 2017-12-04 ENCOUNTER — Encounter (HOSPITAL_COMMUNITY): Payer: Self-pay | Admitting: Emergency Medicine

## 2017-12-04 ENCOUNTER — Emergency Department (HOSPITAL_COMMUNITY)
Admission: EM | Admit: 2017-12-04 | Discharge: 2017-12-04 | Disposition: A | Discharge status: Home / Self Care | Payer: Self-pay | Attending: Emergency Medicine | Admitting: Emergency Medicine

## 2017-12-04 DIAGNOSIS — Z5321 Procedure and treatment not carried out due to patient leaving prior to being seen by health care provider: Secondary | ICD-10-CM | POA: Insufficient documentation

## 2017-12-04 DIAGNOSIS — Z202 Contact with and (suspected) exposure to infections with a predominantly sexual mode of transmission: Secondary | ICD-10-CM | POA: Insufficient documentation

## 2017-12-04 NOTE — ED Triage Notes (Signed)
Pt states her bf is C/O of burning in his penis. Pt states they had sex earlier today and wants and STD check. Pt denies any pain or abnormal discharge.

## 2017-12-04 NOTE — ED Notes (Signed)
Pt told be registration not to go outside in case they were called. Pt seen leaving the department.

## 2017-12-04 NOTE — ED Notes (Signed)
Pt seen leaving the department. 

## 2017-12-19 ENCOUNTER — Other Ambulatory Visit: Payer: Medicaid Other | Admitting: Adult Health

## 2017-12-19 ENCOUNTER — Encounter: Payer: Self-pay | Admitting: *Deleted

## 2018-02-15 ENCOUNTER — Emergency Department (HOSPITAL_COMMUNITY)
Admission: EM | Admit: 2018-02-15 | Discharge: 2018-02-15 | Disposition: A | Discharge status: Home / Self Care | Payer: Medicaid Other | Attending: Emergency Medicine | Admitting: Emergency Medicine

## 2018-02-15 ENCOUNTER — Other Ambulatory Visit: Payer: Self-pay

## 2018-02-15 ENCOUNTER — Encounter (HOSPITAL_COMMUNITY): Payer: Self-pay

## 2018-02-15 DIAGNOSIS — Z711 Person with feared health complaint in whom no diagnosis is made: Secondary | ICD-10-CM | POA: Insufficient documentation

## 2018-02-15 DIAGNOSIS — F17210 Nicotine dependence, cigarettes, uncomplicated: Secondary | ICD-10-CM | POA: Insufficient documentation

## 2018-02-15 DIAGNOSIS — Z3490 Encounter for supervision of normal pregnancy, unspecified, unspecified trimester: Secondary | ICD-10-CM | POA: Insufficient documentation

## 2018-02-15 DIAGNOSIS — L0292 Furuncle, unspecified: Secondary | ICD-10-CM | POA: Insufficient documentation

## 2018-02-15 DIAGNOSIS — O99330 Smoking (tobacco) complicating pregnancy, unspecified trimester: Secondary | ICD-10-CM | POA: Insufficient documentation

## 2018-02-15 DIAGNOSIS — Z3A00 Weeks of gestation of pregnancy not specified: Secondary | ICD-10-CM | POA: Insufficient documentation

## 2018-02-15 DIAGNOSIS — J45909 Unspecified asthma, uncomplicated: Secondary | ICD-10-CM | POA: Insufficient documentation

## 2018-02-15 LAB — URINALYSIS, ROUTINE W REFLEX MICROSCOPIC
BILIRUBIN URINE: NEGATIVE
GLUCOSE, UA: NEGATIVE mg/dL
Ketones, ur: NEGATIVE mg/dL
LEUKOCYTES UA: NEGATIVE
NITRITE: NEGATIVE
PH: 5 (ref 5.0–8.0)
Protein, ur: 100 mg/dL — AB
SPECIFIC GRAVITY, URINE: 1.019 (ref 1.005–1.030)

## 2018-02-15 LAB — WET PREP, GENITAL
SPERM: NONE SEEN
TRICH WET PREP: NONE SEEN
YEAST WET PREP: NONE SEEN

## 2018-02-15 LAB — PREGNANCY, URINE: Preg Test, Ur: POSITIVE — AB

## 2018-02-15 NOTE — Discharge Instructions (Addendum)
You are to follow-up at [facility] in the next 1 to 2 weeks for prenatal care.  Apply warm soaks to the swollen area as frequently as possible.  We will call you if your cultures indicate you require further treatment or action.

## 2018-02-15 NOTE — ED Provider Notes (Signed)
[facility] EMERGENCY DEPARTMENT Provider Note   CSN: [id number] Arrival date & time: 02/15/18  0015     History   Chief Complaint Chief Complaint  Patient presents with  . s74.5    HPI Brandy Chang is a 22 y.o. female.  Patient is a 22 year old female presenting for STD check, concerns of "bump" in groin.  She denies vaginal discharge, but tells me her boyfriend is concerned that she may have an STD.  No abdominal or pelvic pain.  No fevers or chills.  The history is provided by the patient.    Past Medical History:  Diagnosis Date  . Asthma     Patient Active Problem List   Diagnosis Date Noted  . Vulvar abscess 02/15/2016    No past surgical history on file.   OB History    Gravida  0   Para  0   Term  0   Preterm  0   AB  0   Living  0     SAB  0   TAB  0   Ectopic  0   Multiple  0   Live Births  0            Home Medications    Prior to Admission medications   Medication Sig Start Date End Date Taking? Authorizing Provider  doxycycline (VIBRAMYCIN) 100 MG capsule Take 1 capsule (100 mg total) 2 (two) times daily by mouth. One po bid x 7 days 05/06/17   Zadie Rhine, MD  HYDROcodone-acetaminophen (NORCO/VICODIN) 5-325 MG tablet Take 1 tablet every 6 (six) hours as needed by mouth. 05/06/17   Zadie Rhine, MD  ondansetron (ZOFRAN ODT) 8 MG disintegrating tablet 8mg  ODT q4 hours prn nausea 05/06/17   Zadie Rhine, MD    Family History Family History  Problem Relation Age of Onset  . Anemia Mother   . Diabetes Other   . Hypertension Other   . Stroke Other   . Osteoarthritis Other   . Sickle cell anemia Other   . Heart attack Paternal Grandfather   . COPD Maternal Grandmother   . Diabetes Maternal Grandfather   . Sickle cell trait Maternal Grandfather     Social History Social History   Tobacco Use  . Smoking status: Current Every Day Smoker    Packs/day: 0.25    Years: 3.00    Pack years: 0.75    Types:  Cigars  . Smokeless tobacco: Never Used  . Tobacco comment: smokes 1-2 daily  Substance Use Topics  . Alcohol use: No  . Drug use: No    Types: Marijuana    Comment: occ     Allergies   Patient has no known allergies.   Review of Systems Review of Systems  All other systems reviewed and are negative.    Physical Exam Updated Vital Signs BP 126/74 (BP Location: Left Arm)   Pulse (!) 104   Temp 98.6 F (37 C) (Oral)   Resp 16   Ht 5\' 9"  (1.753 m)   Wt 67.1 kg   LMP 01/02/2018 (Approximate)   SpO2 98%   BMI 21.86 kg/m   Physical Exam  Constitutional: She is oriented to person, place, and time. She appears well-developed and well-nourished. No distress.  HENT:  Head: Normocephalic and atraumatic.  Neck: Normal range of motion. Neck supple.  Pulmonary/Chest: Effort normal.  Genitourinary:  Genitourinary Comments: External genitalia is otherwise normal appearing.  There is a whitish discharge present.  There are no adnexal masses and no cervical motion tenderness.  There is a small, sub-1 cm tender nodule under the skin in the pubic region.  Neurological: She is alert and oriented to person, place, and time.  Skin: Skin is warm and dry. She is not diaphoretic.  Nursing note and vitals reviewed.    ED Treatments / Results  Labs (all labs ordered are listed, but only abnormal results are displayed) Labs Reviewed - No data to display  EKG None  Radiology No results found.  Procedures Procedures (including critical care time)  Medications Ordered in ED Medications - No data to display   Initial Impression / Assessment and Plan / ED Course  I have reviewed the triage vital signs and the nursing notes.  Pertinent labs & imaging results that were available during my care of the patient were reviewed by me and considered in my medical decision making (see chart for details).  Patient presenting this evening over concerns of possible STD and requesting a  pregnancy test.  Her wet prep is essentially unremarkable, however her pregnancy test was positive.  She was informed of this finding and advised to follow-up with [facility] who she has seen in the past for GYN reasons.  She is not experiencing any pelvic pain or bleeding and I see no indication for imaging or additional studies.  GC and Chlamydia tests are pending and treatment will be held awaiting these results.  She is also concerned about a small lump in her pelvic region that appears to be a small boil.  No indication at this time for I and D as it is small (<1 cm) and has no fluctuance.  Final Clinical Impressions(s) / ED Diagnoses   Final diagnoses:  None    ED Discharge Orders    None       Geoffery Lyonselo, Alverna Fawley, MD 02/15/18 [phone]

## 2018-02-15 NOTE — ED Triage Notes (Signed)
Pt states she wants to be checked for std's, has no symptoms.  Also wants pregnancy test.  Pt also states she has a possible ingrown hair in her groin area.

## 2018-02-16 ENCOUNTER — Other Ambulatory Visit: Payer: Self-pay

## 2018-02-16 ENCOUNTER — Emergency Department (HOSPITAL_COMMUNITY)
Admission: EM | Admit: 2018-02-16 | Discharge: 2018-02-17 | Disposition: A | Discharge status: Home / Self Care | Payer: Medicaid Other | Attending: Emergency Medicine | Admitting: Emergency Medicine

## 2018-02-16 ENCOUNTER — Encounter (HOSPITAL_COMMUNITY): Payer: Self-pay | Admitting: Emergency Medicine

## 2018-02-16 DIAGNOSIS — O9989 Other specified diseases and conditions complicating pregnancy, childbirth and the puerperium: Secondary | ICD-10-CM | POA: Insufficient documentation

## 2018-02-16 DIAGNOSIS — F17210 Nicotine dependence, cigarettes, uncomplicated: Secondary | ICD-10-CM | POA: Insufficient documentation

## 2018-02-16 DIAGNOSIS — J45909 Unspecified asthma, uncomplicated: Secondary | ICD-10-CM | POA: Insufficient documentation

## 2018-02-16 DIAGNOSIS — Z3490 Encounter for supervision of normal pregnancy, unspecified, unspecified trimester: Secondary | ICD-10-CM

## 2018-02-16 DIAGNOSIS — Z3A00 Weeks of gestation of pregnancy not specified: Secondary | ICD-10-CM | POA: Insufficient documentation

## 2018-02-16 DIAGNOSIS — O99519 Diseases of the respiratory system complicating pregnancy, unspecified trimester: Secondary | ICD-10-CM | POA: Insufficient documentation

## 2018-02-16 DIAGNOSIS — O99330 Smoking (tobacco) complicating pregnancy, unspecified trimester: Secondary | ICD-10-CM | POA: Insufficient documentation

## 2018-02-16 DIAGNOSIS — L02214 Cutaneous abscess of groin: Secondary | ICD-10-CM | POA: Insufficient documentation

## 2018-02-16 LAB — GC/CHLAMYDIA PROBE AMP (~~LOC~~) NOT AT ARMC
Chlamydia: NEGATIVE
NEISSERIA GONORRHEA: NEGATIVE

## 2018-02-16 NOTE — ED Triage Notes (Signed)
Pt states she has a boil to her groin that she was seen here for 2 days ago, pt reports has increased in size

## 2018-02-17 MED ORDER — ACETAMINOPHEN 500 MG PO TABS
1000.0000 mg | ORAL_TABLET | Freq: Once | ORAL | Status: AC
  Administered 2018-02-17: 1000 mg via ORAL
  Filled 2018-02-17: qty 2

## 2018-02-17 MED ORDER — LIDOCAINE HCL (PF) 2 % IJ SOLN
10.0000 mL | Freq: Once | INTRAMUSCULAR | Status: AC
  Administered 2018-02-17: 10 mL

## 2018-02-17 MED ORDER — LIDOCAINE HCL (PF) 2 % IJ SOLN
INTRAMUSCULAR | Status: AC
  Administered 2018-02-17: 1 mL
  Filled 2018-02-17: qty 20

## 2018-02-17 MED ORDER — CLINDAMYCIN HCL 150 MG PO CAPS: ORAL_CAPSULE | ORAL | 0 refills | Status: DC

## 2018-02-17 MED ORDER — CLINDAMYCIN HCL 150 MG PO CAPS
300.0000 mg | ORAL_CAPSULE | Freq: Once | ORAL | Status: AC
  Administered 2018-02-17: 300 mg via ORAL
  Filled 2018-02-17: qty 2

## 2018-02-17 NOTE — ED Provider Notes (Signed)
[facility]ERGENCY [facility]PARTMENT Provider Note   CSN: [id number] Arrival date & time: 02/16/18  2234     History   Chief Complaint Chief Complaint  Patient presents with  . Recurrent Skin Infections    HPI Brandy Chang is a 22 y.o. female.  Patient is a [age] year old female who presents to the emergency department with a complaint of abscess to the pubis area.  The patient states that this problem is been going on for about 3 days.  She was seen in the emergency department 2 days ago.  At that time the abscess was not a candidate for incision and drainage.  She says now it is gotten larger, and more painful.  And she is requesting that it be lanced.  The patient denies fever or chills.  She has not had any drainage.  She says that it still is getting more and more uncomfortable.  It is of note that the patient is pregnant.  She is unsure of exactly how far in her pregnancy she is.  The history is provided by the patient.    Past Medical History:  Diagnosis Date  . Asthma     Patient Active Problem List   Diagnosis Date Noted  . Vulvar abscess 02/15/2016    History reviewed. No pertinent surgical history.   OB History    Gravida  1   Para  0   Term  0   Preterm  0   AB  0   Living  0     SAB  0   TAB  0   Ectopic  0   Multiple  0   Live Births  0            Home Medications    Prior to Admission medications   Medication Sig Start Date End Date Taking? Authorizing Provider  doxycycline (VIBRAMYCIN) 100 MG capsule Take 1 capsule (100 mg total) 2 (two) times daily by mouth. One po bid x 7 days 05/06/17   Zadie RhineWickline, Donald, MD  HYDROcodone-acetaminophen (NORCO/VICODIN) 5-325 MG tablet Take 1 tablet every 6 (six) hours as needed by mouth. 05/06/17   Zadie RhineWickline, Donald, MD  ondansetron (ZOFRAN ODT) 8 MG disintegrating tablet 8mg  ODT q4 hours prn nausea 05/06/17   Zadie RhineWickline, Donald, MD    Family History Family History  Problem Relation Age of Onset   . Anemia Mother   . Diabetes Other   . Hypertension Other   . Stroke Other   . Osteoarthritis Other   . Sickle cell anemia Other   . Heart attack Paternal Grandfather   . COPD Maternal Grandmother   . Diabetes Maternal Grandfather   . Sickle cell trait Maternal Grandfather     Social History Social History   Tobacco Use  . Smoking status: Current Every Day Smoker    Packs/day: 0.25    Years: 3.00    Pack years: 0.75    Types: Cigars  . Smokeless tobacco: Never Used  . Tobacco comment: smokes 1-2 daily  Substance Use Topics  . Alcohol use: No  . Drug use: No    Types: Marijuana    Comment: occ     Allergies   Patient has no known allergies.   Review of Systems Review of Systems  Constitutional: Negative for activity change.       All ROS Neg except as noted in HPI  HENT: Negative for nosebleeds.   Eyes: Negative for photophobia and discharge.  Respiratory: Negative for  cough, shortness of breath and wheezing.   Cardiovascular: Negative for chest pain and palpitations.  Gastrointestinal: Negative for abdominal pain and blood in stool.  Genitourinary: Negative for dysuria, frequency and hematuria.  Musculoskeletal: Negative for arthralgias, back pain and neck pain.  Skin: Negative.        Abscess  Neurological: Negative for dizziness, seizures and speech difficulty.  Psychiatric/Behavioral: Negative for confusion and hallucinations.     Physical Exam Updated Vital Signs BP (!) 119/59 (BP Location: Right Arm)   Pulse 93   Temp 98.2 F (36.8 C) (Oral)   Resp 16   Ht 5\' 8"  (1.727 m)   Wt 67.1 kg   LMP  (Within Weeks)   SpO2 100%   BMI 22.50 kg/m   Physical Exam  Constitutional: Vital signs are normal. She appears well-developed and well-nourished. She is active.  HENT:  Head: Normocephalic and atraumatic.  Right Ear: Tympanic membrane and ear canal normal.  Left Ear: Tympanic membrane and ear canal normal.  Nose: Nose normal.  Mouth/Throat: Uvula  is midline, oropharynx is clear and moist and mucous membranes are normal.  Eyes: Pupils are equal, round, and reactive to light. Conjunctivae, EOM and lids are normal.  Neck: Trachea normal, normal range of motion and phonation normal. Neck supple. Carotid bruit is not present.  Cardiovascular: Normal rate, regular rhythm and normal pulses.  Pulmonary/Chest: Effort normal and breath sounds normal.  Abdominal: Soft. Normal appearance and bowel sounds are normal.  Genitourinary:  Genitourinary Comments: Chaperone present during the examination.  Patient has a quarter size abscess to the left pubis.  It is tender to palpation.  No drainage noted.  No red streaks noted.  The vagina is not involved.  Lymphadenopathy:       Head (right side): No submental, no preauricular and no posterior auricular adenopathy present.       Head (left side): No submental, no preauricular and no posterior auricular adenopathy present.    She has no cervical adenopathy.  Neurological: She is alert. She has normal strength. No cranial nerve deficit or sensory deficit. Coordination normal. GCS eye subscore is 4. GCS verbal subscore is 5. GCS motor subscore is 6.  Skin: Skin is warm and dry.  Psychiatric: Her speech is normal.     ED Treatments / Results  Labs (all labs ordered are listed, but only abnormal results are displayed) Labs Reviewed  AEROBIC CULTURE (SUPERFICIAL SPECIMEN)    EKG None  Radiology No results found.  Procedures .Marland KitchenIncision and Drainage Date/Time: 02/17/2018 12:50 AM Performed by: Ivery Quale, PA-C Authorized by: Ivery Quale, PA-C   Consent:    Consent obtained:  Verbal   Consent given by:  Patient   Risks discussed:  Bleeding and incomplete drainage   Alternatives discussed:  Referral Location:    Type:  Abscess   Location:  Anogenital   Anogenital location: left pubis. Pre-procedure details:    Skin preparation:  Chloraprep (area shaved) Anesthesia (see MAR for  exact dosages):    Anesthesia method:  Local infiltration (pt would not allow complete infiltration. States she can not stand needles.)   Local anesthetic:  Lidocaine 2% w/o epi Procedure type:    Complexity:  Simple Procedure details:    Incision types:  Single straight   Incision depth:  Subcutaneous   Scalpel blade:  11   Wound management:  Irrigated with saline (Pt would not allow probe or deloculation)   Drainage:  Purulent   Wound treatment:  Wound left  open Post-procedure details:    Patient tolerance of procedure: Patient would not allow complete analgesia, and would not allow probing or the loculation.  Incision and drainage carried out, but limited.   (including critical care time)  Medications Ordered in ED Medications  lidocaine (XYLOCAINE) 2 % injection 10 mL (10 mLs Other Given by Other 02/17/18 0030)  clindamycin (CLEOCIN) capsule 300 mg (300 mg Oral Given 02/17/18 0104)  acetaminophen (TYLENOL) tablet 1,000 mg (1,000 mg Oral Given 02/17/18 0105)  lidocaine (XYLOCAINE) 2 % injection (1 mL  Given 02/17/18 0106)     Initial Impression / Assessment and Plan / ED Course  I have reviewed the triage vital signs and the nursing notes.  Pertinent labs & imaging results that were available during my care of the patient were reviewed by me and considered in my medical decision making (see chart for details).       Final Clinical Impressions(s) / ED Diagnoses MDM  Vital signs are within normal limits.  Pulse oximetry is 100% on room air.  Patient has an abscess of the pubis.  Incision and drainage carried out, and culture sent to the lab.  Clindamycin research, and can be used during pregnancy.  Patient will use Tylenol extra strength for pain.  I have asked the patient to see her OB specialist to follow this abscess, and ensure proper healing.  The patient is in agreement with this plan.   Final diagnoses:  Cutaneous abscess of groin  Pregnancy, unspecified gestational age      ED Discharge Orders         Ordered    clindamycin (CLEOCIN) 150 MG capsule     02/17/18 0130           Ivery Quale, PA-C 02/17/18 0142    Devoria Albe, MD 02/17/18 [phone]

## 2018-02-17 NOTE — Discharge Instructions (Signed)
Please use warm tub soaks daily until the wound has healed from the inside out.  Please change the dressing daily.  Use clindamycin 2 times daily.  Use Tylenol extra strength 1000 mg every 4 hours as needed for soreness.  Please discuss this with your OB physician.

## 2018-02-17 NOTE — ED Notes (Signed)
Chaperoned P.A. Beverely PaceBryant in ED room 11 to assess abscess in groin area. Assessment complete.

## 2018-02-20 LAB — AEROBIC CULTURE  (SUPERFICIAL SPECIMEN)

## 2018-02-21 ENCOUNTER — Telehealth: Payer: Self-pay | Admitting: Emergency Medicine

## 2018-02-21 ENCOUNTER — Telehealth: Payer: Self-pay | Admitting: *Deleted

## 2018-02-21 NOTE — Telephone Encounter (Signed)
Post ED Visit - Positive Culture Follow-up  Culture report reviewed by antimicrobial stewardship pharmacist:  []  Enzo Bi, Pharm.D. []  Celedonio Miyamoto, Pharm.D., BCPS AQ-ID []  Garvin Fila, Pharm.D., BCPS []  Georgina Pillion, Pharm.D., BCPS []  [place], [address].D., BCPS, AAHIVP []  Estella Husk, Pharm.D., BCPS, AAHIVP []  Lysle Pearl, PharmD, BCPS []  Phillips Climes, PharmD, BCPS []  Agapito Games, PharmD, BCPS []  Verlan Friends, PharmD  Positive wound culture Treated with clindamycin, organism sensitive to the same and no further patient follow-up is required at this time.  Berle Mull 02/21/2018, 10:59 AM

## 2018-02-21 NOTE — Telephone Encounter (Signed)
Pt went to [facility] er 8-29 was told she was preg / did not know how far along she is/ can you review and let me know what she needs done next

## 2018-02-21 NOTE — Telephone Encounter (Signed)
Patient states she is unsure as to how far along she is.  Last period she thinks was in July.  Will get scheduled for dating u/s tomorrow.

## 2018-02-22 ENCOUNTER — Encounter: Payer: Self-pay | Admitting: *Deleted

## 2018-02-22 ENCOUNTER — Other Ambulatory Visit: Payer: Self-pay | Admitting: *Deleted

## 2018-02-22 DIAGNOSIS — O3680X0 Pregnancy with inconclusive fetal viability, not applicable or unspecified: Secondary | ICD-10-CM

## 2018-02-27 ENCOUNTER — Other Ambulatory Visit: Payer: Self-pay | Admitting: Adult Health

## 2018-02-27 ENCOUNTER — Ambulatory Visit (HOSPITAL_COMMUNITY)
Admission: RE | Admit: 2018-02-27 | Discharge: 2018-02-27 | Disposition: A | Discharge status: Home / Self Care | Payer: Medicaid Other | Source: Ambulatory Visit | Attending: Adult Health | Admitting: Adult Health

## 2018-02-27 DIAGNOSIS — O3680X0 Pregnancy with inconclusive fetal viability, not applicable or unspecified: Secondary | ICD-10-CM

## 2018-02-27 DIAGNOSIS — Z3A09 9 weeks gestation of pregnancy: Secondary | ICD-10-CM | POA: Insufficient documentation

## 2018-02-28 ENCOUNTER — Telehealth: Payer: Self-pay | Admitting: Obstetrics & Gynecology

## 2018-02-28 NOTE — Telephone Encounter (Signed)
Pt aware of [place] and call back tomorrow for new OB appt next week

## 2018-03-07 ENCOUNTER — Encounter (HOSPITAL_COMMUNITY): Payer: Self-pay | Admitting: Emergency Medicine

## 2018-03-07 ENCOUNTER — Other Ambulatory Visit: Payer: Self-pay

## 2018-03-07 ENCOUNTER — Emergency Department (HOSPITAL_COMMUNITY)
Admission: EM | Admit: 2018-03-07 | Discharge: 2018-03-07 | Disposition: A | Discharge status: Home / Self Care | Payer: Medicaid Other | Attending: Emergency Medicine | Admitting: Emergency Medicine

## 2018-03-07 DIAGNOSIS — J45909 Unspecified asthma, uncomplicated: Secondary | ICD-10-CM | POA: Diagnosis not present

## 2018-03-07 DIAGNOSIS — O209 Hemorrhage in early pregnancy, unspecified: Secondary | ICD-10-CM | POA: Diagnosis not present

## 2018-03-07 DIAGNOSIS — F17210 Nicotine dependence, cigarettes, uncomplicated: Secondary | ICD-10-CM | POA: Diagnosis not present

## 2018-03-07 DIAGNOSIS — F17290 Nicotine dependence, other tobacco product, uncomplicated: Secondary | ICD-10-CM | POA: Insufficient documentation

## 2018-03-07 DIAGNOSIS — Z3A10 10 weeks gestation of pregnancy: Secondary | ICD-10-CM | POA: Insufficient documentation

## 2018-03-07 DIAGNOSIS — N939 Abnormal uterine and vaginal bleeding, unspecified: Secondary | ICD-10-CM

## 2018-03-07 LAB — CBC WITH DIFFERENTIAL/PLATELET
Basophils Absolute: 0 10*3/uL (ref 0.0–0.1)
Basophils Relative: 0 %
EOS ABS: 0.1 10*3/uL (ref 0.0–0.7)
EOS PCT: 1 %
HCT: 33.2 % — ABNORMAL LOW (ref 36.0–46.0)
Hemoglobin: 11 g/dL — ABNORMAL LOW (ref 12.0–15.0)
LYMPHS ABS: 3.6 10*3/uL (ref 0.7–4.0)
Lymphocytes Relative: 45 %
MCH: 30.8 pg (ref 26.0–34.0)
MCHC: 33.1 g/dL (ref 30.0–36.0)
MCV: 93 fL (ref 78.0–100.0)
MONOS PCT: 10 %
Monocytes Absolute: 0.8 10*3/uL (ref 0.1–1.0)
Neutro Abs: 3.5 10*3/uL (ref 1.7–7.7)
Neutrophils Relative %: 44 %
PLATELETS: 206 10*3/uL (ref 150–400)
RBC: 3.57 MIL/uL — ABNORMAL LOW (ref 3.87–5.11)
RDW: 12.8 % (ref 11.5–15.5)
WBC: 7.9 10*3/uL (ref 4.0–10.5)

## 2018-03-07 LAB — COMPREHENSIVE METABOLIC PANEL
ALT: 15 U/L (ref 0–44)
ANION GAP: 8 (ref 5–15)
AST: 16 U/L (ref 15–41)
Albumin: 4.4 g/dL (ref 3.5–5.0)
Alkaline Phosphatase: 57 U/L (ref 38–126)
BUN: 9 mg/dL (ref 6–20)
CALCIUM: 9.6 mg/dL (ref 8.9–10.3)
CHLORIDE: 103 mmol/L (ref 98–111)
CO2: 25 mmol/L (ref 22–32)
Creatinine, Ser: 0.67 mg/dL (ref 0.44–1.00)
GFR calc non Af Amer: >60 mL/min (ref >60)
Glucose, Bld: 86 mg/dL (ref 70–99)
Potassium: 3.7 mmol/L (ref 3.5–5.1)
SODIUM: 136 mmol/L (ref 135–145)
Total Bilirubin: 0.5 mg/dL (ref 0.3–1.2)
Total Protein: 7.7 g/dL (ref 6.5–8.1)

## 2018-03-07 LAB — HCG, QUANTITATIVE, PREGNANCY: hCG, Beta Chain, Quant, S: 146333 m[IU]/mL — ABNORMAL HIGH (ref <5)

## 2018-03-07 LAB — ABO/RH: ABO/RH(D): O POS

## 2018-03-07 NOTE — ED Provider Notes (Signed)
[facility] EMERGENCY DEPARTMENT Provider Note   CSN: [id number] Arrival date & time: 03/07/18  1923     History   Chief Complaint Chief Complaint  Patient presents with  . Vaginal Bleeding    ([redacted] weeks pregnant)    HPI Braxtyn E Langston MaskerMorris is a 22 y.o. female.  Patient states she is [redacted] weeks pregnant.  She started having some bleeding today no pain  The history is provided by the patient. No language interpreter was used.  Vaginal Bleeding  Primary symptoms include vaginal bleeding. There has been no fever. This is a new problem. The current episode started 6 to 12 hours ago. The problem occurs constantly. The problem has been resolved. The symptoms occur at rest. She is pregnant. Her LMP was months ago. Pertinent negatives include no abdominal pain, no diarrhea and no frequency.    Past Medical History:  Diagnosis Date  . Asthma     Patient Active Problem List   Diagnosis Date Noted  . Vulvar abscess 02/15/2016    History reviewed. No pertinent surgical history.   OB History    Gravida  1   Para  0   Term  0   Preterm  0   AB  0   Living  0     SAB  0   TAB  0   Ectopic  0   Multiple  0   Live Births  0            Home Medications    Prior to Admission medications   Medication Sig Start Date End Date Taking? Authorizing Provider  clindamycin (CLEOCIN) 150 MG capsule 2 po bid with food Patient not taking: Reported on 03/07/2018 02/17/18   Ivery QualeBryant, Hobson, PA-C    Family History Family History  Problem Relation Age of Onset  . Anemia Mother   . Diabetes Other   . Hypertension Other   . Stroke Other   . Osteoarthritis Other   . Sickle cell anemia Other   . Heart attack Paternal Grandfather   . COPD Maternal Grandmother   . Diabetes Maternal Grandfather   . Sickle cell trait Maternal Grandfather     Social History Social History   Tobacco Use  . Smoking status: Current Every Day Smoker    Packs/day: 0.25    Years: 3.00    Pack  years: 0.75    Types: Cigars  . Smokeless tobacco: Never Used  . Tobacco comment: smokes 1-2 daily  Substance Use Topics  . Alcohol use: No  . Drug use: No    Types: Marijuana    Comment: occ     Allergies   Patient has no known allergies.   Review of Systems Review of Systems  Constitutional: Negative for appetite change and fatigue.  HENT: Negative for congestion, ear discharge and sinus pressure.   Eyes: Negative for discharge.  Respiratory: Negative for cough.   Cardiovascular: Negative for chest pain.  Gastrointestinal: Negative for abdominal pain and diarrhea.  Genitourinary: Positive for vaginal bleeding. Negative for frequency and hematuria.  Musculoskeletal: Negative for back pain.  Skin: Negative for rash.  Neurological: Negative for seizures and headaches.  Psychiatric/Behavioral: Negative for hallucinations.     Physical Exam Updated Vital Signs BP 128/72 (BP Location: Right Arm)   Pulse 85   Temp 98.8 F (37.1 C) (Oral)   Resp 15   LMP 01/02/2018 (Approximate)   SpO2 100%   Physical Exam  Constitutional: She is oriented to person,  place, and time. She appears well-developed.  HENT:  Head: Normocephalic.  Eyes: Conjunctivae and EOM are normal. No scleral icterus.  Neck: Neck supple. No thyromegaly present.  Cardiovascular: Normal rate and regular rhythm. Exam reveals no gallop and no friction rub.  No murmur heard. Pulmonary/Chest: No stridor. She has no wheezes. She has no rales. She exhibits no tenderness.  Abdominal: She exhibits no distension. There is no tenderness. There is no rebound.  Genitourinary:  Genitourinary Comments: Close no bleeding noted.  Nontender uterus and adnexal  Musculoskeletal: Normal range of motion. She exhibits no edema.  Lymphadenopathy:    She has no cervical adenopathy.  Neurological: She is oriented to person, place, and time. She exhibits normal muscle tone. Coordination normal.  Skin: No rash noted. No erythema.    Psychiatric: She has a normal mood and affect. Her behavior is normal.     ED Treatments / Results  Labs (all labs ordered are listed, but only abnormal results are displayed) Labs Reviewed  CBC WITH DIFFERENTIAL/PLATELET - Abnormal; Notable for the following components:      Result Value   RBC 3.57 (*)    Hemoglobin 11.0 (*)    HCT 33.2 (*)    All other components within normal limits  HCG, QUANTITATIVE, PREGNANCY - Abnormal; Notable for the following components:   hCG, Beta Chain, Quant, [place] 161,096 (*)    All other components within normal limits  COMPREHENSIVE METABOLIC PANEL  ABO/RH    EKG None  Radiology No results found.  Procedures Procedures (including critical care time)  Medications Ordered in ED Medications - No data to display   Initial Impression / Assessment and Plan / ED Course  I have reviewed the triage vital signs and the nursing notes.  Pertinent labs & imaging results that were available during my care of the patient were reviewed by me and considered in my medical decision making (see chart for details).     Abs unremarkable.  Patient has not normal vaginal exam.  History of minimal vaginal bleeding today.  She will follow-up with her OB/GYN.  She will call the OB/GYN tomorrow for an appointment Final Clinical Impressions(s) / ED Diagnoses   Final diagnoses:  Vaginal bleeding    ED Discharge Orders    None       Bethann Berkshire, MD 03/07/18 2214

## 2018-03-07 NOTE — Discharge Instructions (Addendum)
Call your OB/GYN office tomorrow morning and let them know about your bleeding and visit today.  If you start having heavy bleeding or severe abdominal pain you should go [place] [facility] [place]

## 2018-03-07 NOTE — ED Triage Notes (Signed)
Pt states around 1 hour ago she began to have "a little" vaginal bleeding. Pt denies any pain with the bleeding. Pt is [redacted] weeks pregnant and states this is her first pregnancy.

## 2018-03-08 ENCOUNTER — Other Ambulatory Visit: Payer: Self-pay | Admitting: Obstetrics and Gynecology

## 2018-03-08 ENCOUNTER — Telehealth: Payer: Self-pay | Admitting: *Deleted

## 2018-03-08 DIAGNOSIS — O208 Other hemorrhage in early pregnancy: Principal | ICD-10-CM

## 2018-03-08 DIAGNOSIS — O209 Hemorrhage in early pregnancy, unspecified: Secondary | ICD-10-CM

## 2018-03-08 NOTE — Telephone Encounter (Signed)
Patient called stating she was wanting to be seen today due to vaginal bleeding.  Went to the ER last night for this issue and was told to f/u with us.  States the bleeding is somewhat heavier but not wearing a pad.  Spoke with JAG who recommended patient have u/s.  Pt informed.  Asked what she was to do in the meantime.  Informed her that unfortunately if she were experiencing a miscarriage, there was not anything that could be done at this time. Advised if bleeding became heavier or clotting was noted, to [facility].  Will schedule for U/S tomorrow in our office. Verbalized understanding.

## 2018-03-09 ENCOUNTER — Ambulatory Visit (INDEPENDENT_AMBULATORY_CARE_PROVIDER_SITE_OTHER): Payer: Medicaid Other

## 2018-03-09 DIAGNOSIS — O208 Other hemorrhage in early pregnancy: Secondary | ICD-10-CM

## 2018-03-09 DIAGNOSIS — O209 Hemorrhage in early pregnancy, unspecified: Secondary | ICD-10-CM

## 2018-03-09 NOTE — Progress Notes (Signed)
US 10+6 wks,measurements c/w dates,crl 40.46 mm,normal ovaries bilat,fhr 167 bpm,posterior pl gr 0

## 2018-03-26 ENCOUNTER — Other Ambulatory Visit: Payer: Self-pay | Admitting: Obstetrics & Gynecology

## 2018-03-26 DIAGNOSIS — Z3682 Encounter for antenatal screening for nuchal translucency: Secondary | ICD-10-CM

## 2018-03-27 ENCOUNTER — Ambulatory Visit (INDEPENDENT_AMBULATORY_CARE_PROVIDER_SITE_OTHER): Payer: Medicaid Other | Admitting: Advanced Practice Midwife

## 2018-03-27 ENCOUNTER — Ambulatory Visit (INDEPENDENT_AMBULATORY_CARE_PROVIDER_SITE_OTHER): Payer: Medicaid Other

## 2018-03-27 ENCOUNTER — Encounter: Payer: Self-pay | Admitting: Advanced Practice Midwife

## 2018-03-27 ENCOUNTER — Ambulatory Visit: Payer: Medicaid Other | Admitting: *Deleted

## 2018-03-27 VITALS — BP 104/60 | HR 79 | Wt 153.0 lb

## 2018-03-27 DIAGNOSIS — Z3682 Encounter for antenatal screening for nuchal translucency: Secondary | ICD-10-CM

## 2018-03-27 DIAGNOSIS — Z3402 Encounter for supervision of normal first pregnancy, second trimester: Secondary | ICD-10-CM

## 2018-03-27 DIAGNOSIS — Z331 Pregnant state, incidental: Secondary | ICD-10-CM | POA: Diagnosis not present

## 2018-03-27 DIAGNOSIS — Z3400 Encounter for supervision of normal first pregnancy, unspecified trimester: Secondary | ICD-10-CM | POA: Insufficient documentation

## 2018-03-27 DIAGNOSIS — Z3A13 13 weeks gestation of pregnancy: Secondary | ICD-10-CM | POA: Diagnosis not present

## 2018-03-27 DIAGNOSIS — Z1389 Encounter for screening for other disorder: Secondary | ICD-10-CM

## 2018-03-27 DIAGNOSIS — F17200 Nicotine dependence, unspecified, uncomplicated: Secondary | ICD-10-CM | POA: Insufficient documentation

## 2018-03-27 DIAGNOSIS — Z1379 Encounter for other screening for genetic and chromosomal anomalies: Secondary | ICD-10-CM

## 2018-03-27 LAB — POCT URINALYSIS DIPSTICK OB
Blood, UA: NEGATIVE
Glucose, UA: NEGATIVE
Ketones, UA: NEGATIVE
LEUKOCYTES UA: NEGATIVE
Nitrite, UA: NEGATIVE
POC,PROTEIN,UA: NEGATIVE

## 2018-03-27 NOTE — Progress Notes (Signed)
[place] 13+3 wks,measurements c/w dates,crl 70.60 mm,posterior placenta gr 0,normal ovaries bilat,NB present,NT 1.8 mm,FHT 152 bpm

## 2018-03-27 NOTE — Progress Notes (Signed)
INITIAL OBSTETRICAL VISIT Patient name: Brandy Chang MRN [id number]  Date of birth: [birth year] Chief Complaint:   Initial Prenatal Visit (nt/it)  History of Present Illness:   Brandy Chang is a 22 y.o. G62P0000 African American female at [redacted]w[redacted]d by 10 week [place] with an Estimated Date of Delivery: 09/29/18 being seen today for her initial obstetrical visit.   Her obstetrical history is significant for first pregnancy.   Today she reports no complaints.  Patient's last menstrual period was 01/02/2018 (lmp unknown). Has to get to work, so wants to do pap/flu shot next visit. Works at [employer] Review of Systems:   Pertinent items are noted in HPI Denies cramping/contractions, leakage of fluid, vaginal bleeding, abnormal vaginal discharge w/ itching/odor/irritation, headaches, visual changes, shortness of breath, chest pain, abdominal pain, severe nausea/vomiting, or problems with urination or bowel movements unless otherwise stated above.  Pertinent History Reviewed:  Reviewed past medical,surgical, social, obstetrical and family history.  Reviewed problem list, medications and allergies. OB History  Gravida Para Term Preterm AB Living  1 0 0 0 0 0  SAB TAB Ectopic Multiple Live Births  0 0 0 0 0    # Outcome Date GA Lbr Len/2nd Weight Sex Delivery Anes PTL Lv  1 Current            Physical Assessment:   Vitals:   03/27/18 1509  BP: 104/60  Pulse: 79  Weight: 153 lb (69.4 kg)  Body mass index is 23.26 kg/m.       Physical Examination:  General appearance - well appearing, and in no distress  Mental status - alert, oriented to person, place, and time  Psych:  She has a normal mood and affect  Skin - warm and dry, normal color, no suspicious lesions noted  Chest - effort normal, all lung fields clear to auscultation bilaterally  Heart - normal rate and regular rhythm  Abdomen - soft, nontender  Extremities:  No swelling or varicosities noted    Results for orders placed or  performed in visit on 03/27/18 (from the past 24 hour(s))  POC Urinalysis Dipstick OB   Collection Time: 03/27/18  3:15 PM  Result Value Ref Range   Color, UA     Clarity, UA     Glucose, UA Negative Negative   Bilirubin, UA     Ketones, UA neg    Spec Grav, UA     Blood, UA neg    pH, UA     POC Protein UA Negative Negative, Trace   Urobilinogen, UA     Nitrite, UA neg    Leukocytes, UA Negative Negative   Appearance     Odor      Assessment & Plan:  1) Low-Risk Pregnancy G1P0000 at [redacted]w[redacted]d with an Estimated Date of Delivery: 09/29/18   2) Initial OB visit  3)   Meds: No orders of the defined types were placed in this encounter.  [place] 13+3 wks,measurements c/w dates,crl 70.60 mm,posterior placenta gr 0,normal ovaries bilat,NB present,NT 1.8 mm,FHT 152 bpm  Initial labs obtained Continue prenatal vitamins Reviewed n/v relief measures and warning s/s to report Reviewed recommended weight gain based on pre-gravid BMI Encouraged well-balanced diet Genetic Screening discussed Integrated Screen: requested Cystic fibrosis screening discussed declined Ultrasound discussed; fetal survey: requested CCNC completed  Follow-up: Return in about 4 weeks (around 04/24/2018) for LROB, 2nd IT.   Orders Placed This Encounter  Procedures  . GC/Chlamydia Probe Amp  . Urine Culture  .  Obstetric Panel, Including HIV  . Sickle cell screen  . Urinalysis, Routine w reflex microscopic  . Integrated 1  . Pain Management Screening Profile (10S)  . Cystic Fibrosis Mutation 97  . POC Urinalysis Dipstick OB    Jacklyn Shell DNP, CNM 03/27/2018 4:07 PM

## 2018-03-28 LAB — PMP SCREEN PROFILE (10S), URINE
AMPHETAMINE SCREEN URINE: NEGATIVE ng/mL
BARBITURATE SCREEN URINE: NEGATIVE ng/mL
BENZODIAZEPINE SCREEN, URINE: NEGATIVE ng/mL
CANNABINOIDS UR QL SCN: POSITIVE ng/mL — AB
CREATININE(CRT), U: 95.1 mg/dL (ref 20.0–300.0)
Cocaine (Metab) Scrn, Ur: NEGATIVE ng/mL
METHADONE SCREEN, URINE: NEGATIVE ng/mL
OXYCODONE+OXYMORPHONE UR QL SCN: NEGATIVE ng/mL
Opiate Scrn, Ur: NEGATIVE ng/mL
PHENCYCLIDINE QUANTITATIVE URINE: NEGATIVE ng/mL
PROPOXYPHENE SCREEN URINE: NEGATIVE ng/mL
Ph of Urine: 7.5 (ref 4.5–8.9)

## 2018-03-28 LAB — GC/CHLAMYDIA PROBE AMP
CHLAMYDIA, DNA PROBE: NEGATIVE
Neisseria gonorrhoeae by PCR: NEGATIVE

## 2018-03-29 LAB — OBSTETRIC PANEL, INCLUDING HIV
Antibody Screen: NEGATIVE
Basophils Absolute: 0 10*3/uL (ref 0.0–0.2)
Basos: 0 %
EOS (ABSOLUTE): 0.1 10*3/uL (ref 0.0–0.4)
EOS: 1 %
HEMATOCRIT: 35.1 % (ref 34.0–46.6)
HEMOGLOBIN: 11.8 g/dL (ref 11.1–15.9)
HIV SCREEN 4TH GENERATION: NONREACTIVE
Hepatitis B Surface Ag: NEGATIVE
Immature Grans (Abs): 0 10*3/uL (ref 0.0–0.1)
Immature Granulocytes: 0 %
LYMPHS: 32 %
Lymphocytes Absolute: 2.3 10*3/uL (ref 0.7–3.1)
MCH: 31.1 pg (ref 26.6–33.0)
MCHC: 33.6 g/dL (ref 31.5–35.7)
MCV: 93 fL (ref 79–97)
MONOS ABS: 0.5 10*3/uL (ref 0.1–0.9)
Monocytes: 8 %
NEUTROS ABS: 4.2 10*3/uL (ref 1.4–7.0)
Neutrophils: 59 %
Platelets: 250 10*3/uL (ref 150–450)
RBC: 3.79 x10E6/uL (ref 3.77–5.28)
RDW: 13.2 % (ref 12.3–15.4)
RH TYPE: POSITIVE
RPR Ser Ql: NONREACTIVE
RUBELLA: 5.48 {index} (ref >0.99)
WBC: 7.1 10*3/uL (ref 3.4–10.8)

## 2018-03-29 LAB — INTEGRATED 1
Crown Rump Length: 70.6 mm
Gest. Age on Collection Date: 13 weeks
Maternal Age at EDD: 21.5 yr
NUCHAL TRANSLUCENCY (NT): 1.8 mm
NUMBER OF FETUSES: 1
PAPP-A Value: 2058.9 ng/mL
Weight: 153 [lb_av]

## 2018-03-29 LAB — URINE CULTURE

## 2018-03-29 LAB — URINALYSIS, ROUTINE W REFLEX MICROSCOPIC
BILIRUBIN UA: NEGATIVE
Glucose, UA: NEGATIVE
KETONES UA: NEGATIVE
LEUKOCYTES UA: NEGATIVE
Nitrite, UA: NEGATIVE
PH UA: 7 (ref 5.0–7.5)
Protein, UA: NEGATIVE
RBC UA: NEGATIVE
SPEC GRAV UA: 1.014 (ref 1.005–1.030)
Urobilinogen, Ur: 0.2 mg/dL (ref 0.2–1.0)

## 2018-03-29 LAB — SICKLE CELL SCREEN: Sickle Cell Screen: NEGATIVE

## 2018-04-04 LAB — CYSTIC FIBROSIS MUTATION 97

## 2018-04-05 ENCOUNTER — Encounter: Payer: Self-pay | Admitting: Advanced Practice Midwife

## 2018-04-05 DIAGNOSIS — Z141 Cystic fibrosis carrier: Secondary | ICD-10-CM | POA: Insufficient documentation

## 2018-04-24 ENCOUNTER — Other Ambulatory Visit (HOSPITAL_COMMUNITY)
Admission: RE | Admit: 2018-04-24 | Discharge: 2018-04-24 | Disposition: A | Discharge status: Home / Self Care | Payer: Medicaid Other | Source: Ambulatory Visit | Attending: Obstetrics & Gynecology | Admitting: Obstetrics & Gynecology

## 2018-04-24 ENCOUNTER — Encounter: Payer: Self-pay | Admitting: Obstetrics & Gynecology

## 2018-04-24 ENCOUNTER — Ambulatory Visit (INDEPENDENT_AMBULATORY_CARE_PROVIDER_SITE_OTHER): Payer: Medicaid Other | Admitting: Obstetrics & Gynecology

## 2018-04-24 VITALS — BP 103/64 | HR 91 | Wt 154.4 lb

## 2018-04-24 DIAGNOSIS — Z124 Encounter for screening for malignant neoplasm of cervix: Secondary | ICD-10-CM | POA: Diagnosis not present

## 2018-04-24 DIAGNOSIS — Z1379 Encounter for other screening for genetic and chromosomal anomalies: Secondary | ICD-10-CM | POA: Diagnosis not present

## 2018-04-24 DIAGNOSIS — Z3A17 17 weeks gestation of pregnancy: Secondary | ICD-10-CM

## 2018-04-24 DIAGNOSIS — Z1389 Encounter for screening for other disorder: Secondary | ICD-10-CM

## 2018-04-24 DIAGNOSIS — Z331 Pregnant state, incidental: Secondary | ICD-10-CM

## 2018-04-24 DIAGNOSIS — Z3402 Encounter for supervision of normal first pregnancy, second trimester: Secondary | ICD-10-CM

## 2018-04-24 DIAGNOSIS — B373 Candidiasis of vulva and vagina: Secondary | ICD-10-CM

## 2018-04-24 LAB — POCT URINALYSIS DIPSTICK OB
Blood, UA: NEGATIVE
Glucose, UA: NEGATIVE
KETONES UA: NEGATIVE
LEUKOCYTES UA: NEGATIVE
NITRITE UA: NEGATIVE
PROTEIN: NEGATIVE

## 2018-04-24 MED ORDER — TERCONAZOLE 0.4 % VA CREA: 1.0000 | TOPICAL_CREAM | Freq: Every day | VAGINAL | 0 refills | Status: DC

## 2018-04-24 NOTE — Progress Notes (Signed)
   LOW-RISK PREGNANCY VISIT Patient name: Brandy Chang MRN [id number]  Date of birth: Nov 29, 1995 Chief Complaint:   Routine Prenatal Visit (2nd IT/ pap/ need RX prenatal)  History of Present Illness:   Brandy Chang is a 22 y.o. G45P0000 female at [redacted]w[redacted]d with an Estimated Date of Delivery: 09/29/18 being seen today for ongoing management of a low-risk pregnancy.  Today she reports no complaints. Contractions: Not present.  .   . denies leaking of fluid. Review of Systems:   Pertinent items are noted in HPI Denies abnormal vaginal discharge w/ itching/odor/irritation, headaches, visual changes, shortness of breath, chest pain, abdominal pain, severe nausea/vomiting, or problems with urination or bowel movements unless otherwise stated above. Pertinent History Reviewed:  Reviewed past medical,surgical, social, obstetrical and family history.  Reviewed problem list, medications and allergies. Physical Assessment:   Vitals:   04/24/18 1340  BP: 103/64  Pulse: 91  Weight: 154 lb 6.4 oz (70 kg)  Body mass index is 23.48 kg/m.        Physical Examination:   General appearance: Well appearing, and in no distress  Mental status: Alert, oriented to person, place, and time  Skin: Warm & dry  Cardiovascular: Normal heart rate noted  Respiratory: Normal respiratory effort, no distress  Abdomen: Soft, gravid, nontender  Pelvic: Cervical exam deferred         Extremities: Edema: Other (Comment)  Fetal Status: Fetal Heart Rate (bpm): 150        Results for orders placed or performed in visit on 04/24/18 (from the past 24 hour(s))  POC Urinalysis Dipstick OB   Collection Time: 04/24/18  1:50 PM  Result Value Ref Range   Color, UA     Clarity, UA     Glucose, UA Negative Negative   Bilirubin, UA     Ketones, UA neg    Spec Grav, UA     Blood, UA neg    pH, UA     POC Protein UA Negative Negative, Trace   Urobilinogen, UA     Nitrite, UA neg    Leukocytes, UA Negative Negative   Appearance     Odor      Assessment & Plan:  1) Low-risk pregnancy G1P0000 at [redacted]w[redacted]d with an Estimated Date of Delivery: 09/29/18   2) Yeast vaginitis, terazol   Meds:  Meds ordered this encounter  Medications  . terconazole (TERAZOL 7) 0.4 % vaginal cream    Sig: Place 1 applicator vaginally at bedtime.    Dispense:  45 g    Refill:  0   Labs/procedures today: 2nd IT  Plan:  Continue routine obstetrical care   Reviewed: Preterm labor symptoms and general obstetric precautions including but not limited to vaginal bleeding, contractions, leaking of fluid and fetal movement were reviewed in detail with the patient.  All questions were answered  Follow-up: Return in about 3 weeks (around 05/15/2018) for 20 week sono, LROB.  Orders Placed This Encounter  Procedures  . US OB Comp + 14 Wk  . INTEGRATED 2  . POC Urinalysis Dipstick OB   Amaryllis Dyke Jesi Jurgens  04/24/2018 2:16 PM

## 2018-04-25 ENCOUNTER — Telehealth: Payer: Self-pay | Admitting: Advanced Practice Midwife

## 2018-04-25 NOTE — Telephone Encounter (Signed)
Spoke to pharmacy who stated they quoted her the cash price since it was out of stock but is covered by her insurace.  I advised her to call pharmacy or to go to pharmacy and get medication over the counter. verbalized understanding.

## 2018-04-25 NOTE — Telephone Encounter (Signed)
Patient called stating that she seen Dr. Despina Hidden yesterday and he told her that she had a build up of yeast, he sent over a medication to the pharmacy but it cost 86.00 and she can not afford this, pt would like to know if there is something else she could take that is less expensive. Please contact pt

## 2018-04-26 LAB — INTEGRATED 2
AFP MARKER: 47 ng/mL
AFP MOM: 1.22
CROWN RUMP LENGTH: 70.6 mm
DIA MOM: 1.46
DIA Value: 233.3 pg/mL
Estriol, Unconjugated: 1.65 ng/mL
GEST. AGE ON COLLECTION DATE: 13 wk
GESTATIONAL AGE: 17 wk
HCG MOM: 0.67
MATERNAL AGE AT EDD: 21.5 a
NUMBER OF FETUSES: 1
Nuchal Translucency (NT): 1.8 mm
Nuchal Translucency MoM: 1.12
PAPP-A MOM: 1.84
PAPP-A Value: 2058.9 ng/mL
TEST RESULTS: NEGATIVE
WEIGHT: 153 [lb_av]
WEIGHT: 154 [lb_av]
hCG Value: 19.3 IU/mL
uE3 MoM: 1.54

## 2018-04-27 ENCOUNTER — Encounter: Payer: Self-pay | Admitting: *Deleted

## 2018-04-27 LAB — CYTOLOGY - PAP
ADEQUACY: ABSENT — AB
Chlamydia: NEGATIVE
NEISSERIA GONORRHEA: NEGATIVE

## 2018-05-14 ENCOUNTER — Other Ambulatory Visit: Payer: Self-pay | Admitting: Obstetrics & Gynecology

## 2018-05-14 DIAGNOSIS — Z363 Encounter for antenatal screening for malformations: Secondary | ICD-10-CM

## 2018-05-15 ENCOUNTER — Ambulatory Visit (INDEPENDENT_AMBULATORY_CARE_PROVIDER_SITE_OTHER): Payer: Medicaid Other

## 2018-05-15 ENCOUNTER — Ambulatory Visit (INDEPENDENT_AMBULATORY_CARE_PROVIDER_SITE_OTHER): Payer: Medicaid Other | Admitting: Obstetrics & Gynecology

## 2018-05-15 ENCOUNTER — Encounter: Payer: Self-pay | Admitting: Obstetrics & Gynecology

## 2018-05-15 VITALS — BP 105/61 | HR 90 | Wt 155.0 lb

## 2018-05-15 DIAGNOSIS — Z3402 Encounter for supervision of normal first pregnancy, second trimester: Secondary | ICD-10-CM

## 2018-05-15 DIAGNOSIS — Z141 Cystic fibrosis carrier: Secondary | ICD-10-CM

## 2018-05-15 DIAGNOSIS — Z3A20 20 weeks gestation of pregnancy: Secondary | ICD-10-CM

## 2018-05-15 DIAGNOSIS — N870 Mild cervical dysplasia: Secondary | ICD-10-CM | POA: Diagnosis not present

## 2018-05-15 DIAGNOSIS — Z331 Pregnant state, incidental: Secondary | ICD-10-CM

## 2018-05-15 DIAGNOSIS — Z363 Encounter for antenatal screening for malformations: Secondary | ICD-10-CM | POA: Diagnosis not present

## 2018-05-15 DIAGNOSIS — Z1389 Encounter for screening for other disorder: Secondary | ICD-10-CM

## 2018-05-15 MED ORDER — TERCONAZOLE 0.4 % VA CREA: 1.0000 | TOPICAL_CREAM | Freq: Every day | VAGINAL | 0 refills | Status: DC

## 2018-05-15 NOTE — Progress Notes (Signed)
US 20+3 wks,breech,posterior low lying placenta gr 0,tip of placenta to cervix 1.2 cm,svp of fluid 4.5 cm,normal ovaries bilat,fhr 144 bpm,EFW 327 g 24%,anatomy complete

## 2018-05-15 NOTE — Progress Notes (Signed)
Colposcopy Procedure Note:    LOW-RISK PREGNANCY VISIT Patient name: Brandy Chang MRN [id number]  Date of birth: 1995/12/06 Chief Complaint:   Routine Prenatal Visit (US today and colpo)  History of Present Illness:   Brandy Chang is a 22 y.o. 511P0000 female at 2937w3d with an Estimated Date of Delivery: 09/29/18 being seen today for ongoing management of a low-risk pregnancy.  Today she reports no complaints. Contractions: Not present. Vag. Bleeding: None.  Movement: Present. denies leaking of fluid. Review of Systems:   Pertinent items are noted in HPI Denies abnormal vaginal discharge w/ itching/odor/irritation, headaches, visual changes, shortness of breath, chest pain, abdominal pain, severe nausea/vomiting, or problems with urination or bowel movements unless otherwise stated above. Pertinent History Reviewed:  Reviewed past medical,surgical, social, obstetrical and family history.  Reviewed problem list, medications and allergies. Physical Assessment:   Vitals:   05/15/18 1433  BP: 105/61  Pulse: 90  Weight: 155 lb (70.3 kg)  Body mass index is 23.57 kg/m.        Physical Examination:   General appearance: Well appearing, and in no distress  Mental status: Alert, oriented to person, place, and time  Skin: Warm & dry  Cardiovascular: Normal heart rate noted  Respiratory: Normal respiratory effort, no distress  Abdomen: Soft, gravid, nontender  Pelvic: Cervical exam deferred         Extremities: Edema: None  Fetal Status:     Movement: Present    No results found for this or any previous visit (from the past 24 hour(s)).  Assessment & Plan:  1) Low-risk pregnancy G1P0000 at 5637w3d with an Estimated Date of Delivery: 09/29/18   2) colpo today,    Meds:  Meds ordered this encounter  Medications  . terconazole (TERAZOL 7) 0.4 % vaginal cream    Sig: Place 1 applicator vaginally at bedtime.    Dispense:  45 g    Refill:  0   Labs/procedures today:  colposcopy  Plan:  Continue routine obstetrical care   Reviewed:  Follow-up: No follow-ups on file.  Orders Placed This Encounter  Procedures  . POC Urinalysis Dipstick OB   Lazaro ArmsLuther H Eure CNM, [facility] 05/15/2018 3:13 PM     Colposcopy Procedure Note  Indications: Pap smear 1 months ago showed: low-grade squamous intraepithelial neoplasia (LGSIL - encompassing HPV,mild dysplasia,CIN I). The prior pap showed low-grade squamous intraepithelial neoplasia (LGSIL - encompassing HPV,mild dysplasia,CIN I).  Prior cervical/vaginal disease: . Prior cervical treatment: .  Smoker:  Yes.   New sexual partner:  No.   History of abnormal Pap: no  Procedure Details  The risks and benefits of the procedure and Written informed consent obtained.  Speculum placed in vagina and excellent visualization of cervix achieved, cervix swabbed x 3 with acetic acid solution.  Findings: Cervix: acetowhite changes at 6 o'clock; no biopsies taken and SCJ visualized - lesion at 6 o'clock. Vaginal inspection: normal without visible lesions. Vulvar colposcopy: vulvar colposcopy not performed.  Specimens: none  Complications: none.  Plan: Repeat colposcopy 6-8 weeks postpartum

## 2018-06-11 ENCOUNTER — Ambulatory Visit (INDEPENDENT_AMBULATORY_CARE_PROVIDER_SITE_OTHER): Payer: Medicaid Other | Admitting: Women's Health

## 2018-06-11 ENCOUNTER — Encounter: Payer: Self-pay | Admitting: Women's Health

## 2018-06-11 VITALS — BP 113/66 | HR 85 | Wt 165.5 lb

## 2018-06-11 DIAGNOSIS — Z3A24 24 weeks gestation of pregnancy: Secondary | ICD-10-CM

## 2018-06-11 DIAGNOSIS — Z141 Cystic fibrosis carrier: Secondary | ICD-10-CM

## 2018-06-11 DIAGNOSIS — R87612 Low grade squamous intraepithelial lesion on cytologic smear of cervix (LGSIL): Secondary | ICD-10-CM

## 2018-06-11 DIAGNOSIS — R42 Dizziness and giddiness: Secondary | ICD-10-CM | POA: Diagnosis not present

## 2018-06-11 DIAGNOSIS — Z3402 Encounter for supervision of normal first pregnancy, second trimester: Secondary | ICD-10-CM

## 2018-06-11 DIAGNOSIS — R87619 Unspecified abnormal cytological findings in specimens from cervix uteri: Secondary | ICD-10-CM | POA: Insufficient documentation

## 2018-06-11 DIAGNOSIS — O99012 Anemia complicating pregnancy, second trimester: Secondary | ICD-10-CM

## 2018-06-11 DIAGNOSIS — O4442 Low lying placenta NOS or without hemorrhage, second trimester: Secondary | ICD-10-CM

## 2018-06-11 DIAGNOSIS — O4440 Low lying placenta NOS or without hemorrhage, unspecified trimester: Secondary | ICD-10-CM

## 2018-06-11 DIAGNOSIS — Z1389 Encounter for screening for other disorder: Secondary | ICD-10-CM

## 2018-06-11 DIAGNOSIS — Z331 Pregnant state, incidental: Secondary | ICD-10-CM

## 2018-06-11 LAB — POCT URINALYSIS DIPSTICK OB
Glucose, UA: NEGATIVE
Ketones, UA: NEGATIVE
NITRITE UA: NEGATIVE
PROTEIN: NEGATIVE

## 2018-06-11 LAB — POCT HEMOGLOBIN: Hemoglobin: 9.9 g/dL — AB (ref 11–14.6)

## 2018-06-11 MED ORDER — FERROUS SULFATE 325 (65 FE) MG PO TABS: 325.0000 mg | ORAL_TABLET | Freq: Two times a day (BID) | ORAL | 3 refills | Status: DC

## 2018-06-11 NOTE — Progress Notes (Signed)
LOW-RISK PREGNANCY VISIT Patient name: Brandy Chang MRN [id number]  Date of birth: [birth year] Chief Complaint:   Routine Prenatal Visit (feels faint)  History of Present Illness:   Brandy Chang is a 22 y.o. 561P0000 female at 4464w2d with an Estimated Date of Delivery: 09/29/18 being seen today for ongoing management of a low-risk pregnancy.  Today she reports multiple episodes of feeling faint, had to leave work yesterday. Contractions: Not present. Vag. Bleeding: None.  Movement: Present. denies leaking of fluid. Review of Systems:   Pertinent items are noted in HPI Denies abnormal vaginal discharge w/ itching/odor/irritation, headaches, visual changes, shortness of breath, chest pain, abdominal pain, severe nausea/vomiting, or problems with urination or bowel movements unless otherwise stated above. Pertinent History Reviewed:  Reviewed past medical,surgical, social, obstetrical and family history.  Reviewed problem list, medications and allergies. Physical Assessment:   Vitals:   06/11/18 1045  BP: 113/66  Pulse: 85  Weight: 165 lb 8 oz (75.1 kg)  Body mass index is 25.16 kg/m.        Physical Examination:   General appearance: Well appearing, and in no distress  Mental status: Alert, oriented to person, place, and time  Skin: Warm & dry  Cardiovascular: Normal heart rate noted  Respiratory: Normal respiratory effort, no distress  Abdomen: Soft, gravid, nontender  Pelvic: Cervical exam deferred         Extremities: Edema: None  Fetal Status: Fetal Heart Rate (bpm): 150 Fundal Height: 22 cm Movement: Present    Results for orders placed or performed in visit on 06/11/18 (from the past 24 hour(s))  POC Urinalysis Dipstick OB   Collection Time: 06/11/18 10:48 AM  Result Value Ref Range   Color, UA     Clarity, UA     Glucose, UA Negative Negative   Bilirubin, UA     Ketones, UA neg    Spec Grav, UA     Blood, UA trace    pH, UA     POC,PROTEIN,UA Negative  Negative, Trace, Small (1+), Moderate (2+), Large (3+), 4+   Urobilinogen, UA     Nitrite, UA neg    Leukocytes, UA Trace (A) Negative   Appearance     Odor    POCT hemoglobin   Collection Time: 06/11/18 11:07 AM  Result Value Ref Range   Hemoglobin 9.9 (A) 11 - 14.6 g/dL    Assessment & Plan:  1) Low-risk pregnancy G1P0000 at 8064w2d with an Estimated Date of Delivery: 09/29/18   2) Low-lying placenta, no bleeding, will repeat u/s @ 28wks  3) Anemia> denies PICA, continue pnv, rx fe bid  4) Dizziness/feels faint> possibly related to anemia, gave printed prevention/relief measures   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 ferrous sulfate 325 (65 FE) MG tablet    Sig: Take 1 tablet (325 mg total) by mouth 2 (two) times daily with a meal.    Dispense:  60 tablet    Refill:  3    Order Specific Question:   Supervising Provider    Answer:   Lazaro ArmsEURE, LUTHER H [[id number]]   Labs/procedures today: hgb, declines flu shot  Plan:  Continue routine obstetrical care   Reviewed: Preterm labor symptoms and general obstetric precautions including but not limited to vaginal bleeding, contractions, leaking of fluid and fetal movement were reviewed in detail with the patient.  All questions were answered  Follow-up: Return in about 4  weeks (around 07/09/2018) for LROB, PN2, US:OB F/U .  Orders Placed This Encounter  Procedures  . US OB Follow Up  . POC Urinalysis Dipstick OB  . POCT hemoglobin   Cheral MarkerKimberly R Kamrie Fanton CNM, [facility]NP-BC 06/11/2018 11:32 AM

## 2018-06-11 NOTE — Patient Instructions (Addendum)
Brandy Chang, I greatly value your feedback.  If you receive a survey following your visit with [place] today, we appreciate you taking the time to fill it out.  Thanks, Joellyn Haff, CNM, WHNP-BC   You will have your sugar test next visit.  Please do not eat or drink anything after midnight the night before you come, not even water.  You will be here for at least two hours.     Call the office [phone]) or go to [facility] if:  You begin to have strong, frequent contractions  Your water breaks.  Sometimes it is a big gush of fluid, sometimes it is just a trickle that keeps getting your panties wet or running down your legs  You have vaginal bleeding.  It is normal to have a small amount of spotting if your cervix was checked.   You don't feel your baby moving like normal.  If you don't, get you something to eat and drink and lay down and focus on feeling your baby move.   If your baby is still not moving like normal, you should call the office or go to [facility].  For Dizzy Spells:   This is usually related to either your blood sugar or your blood pressure dropping  Make sure you are staying well hydrated and drinking enough water so that your urine is clear  Eat small frequent meals and snacks containing protein (meat, eggs, nuts, cheese) so that your blood sugar doesn't drop  If you do get dizzy, sit/lay down and get you something to drink and a snack containing protein- you will usually start feeling better in 10-20 minutes     Second Trimester of Pregnancy The second trimester is from week 13 through week 28, months 4 through 6. The second trimester is often a time when you feel your best. Your body has also adjusted to being pregnant, and you begin to feel better physically. Usually, morning sickness has lessened or quit completely, you may have more energy, and you may have an increase in appetite. The second trimester is also a time when the fetus is growing rapidly.  At the end of the sixth month, the fetus is about 9 inches long and weighs about 1 pounds. You will likely begin to feel the baby move (quickening) between 18 and 20 weeks of the pregnancy. BODY CHANGES Your body goes through many changes during pregnancy. The changes vary from woman to woman.   Your weight will continue to increase. You will notice your lower abdomen bulging out.  You may begin to get stretch marks on your hips, abdomen, and breasts.  You may develop headaches that can be relieved by medicines approved by your health care provider.  You may urinate more often because the fetus is pressing on your bladder.  You may develop or continue to have heartburn as a result of your pregnancy.  You may develop constipation because certain hormones are causing the muscles that push waste through your intestines to slow down.  You may develop hemorrhoids or swollen, bulging veins (varicose veins).  You may have back pain because of the weight gain and pregnancy hormones relaxing your joints between the bones in your pelvis and as a result of a shift in weight and the muscles that support your balance.  Your breasts will continue to grow and be tender.  Your gums may bleed and may be sensitive to brushing and flossing.  Dark spots or blotches (chloasma, mask  of pregnancy) may develop on your face. This will likely fade after the baby is born.  A dark line from your belly button to the pubic area (linea nigra) may appear. This will likely fade after the baby is born.  You may have changes in your hair. These can include thickening of your hair, rapid growth, and changes in texture. Some women also have hair loss during or after pregnancy, or hair that feels dry or thin. Your hair will most likely return to normal after your baby is born. WHAT TO EXPECT AT YOUR PRENATAL VISITS During a routine prenatal visit:  You will be weighed to make sure you and the fetus are growing  normally.  Your blood pressure will be taken.  Your abdomen will be measured to track your baby's growth.  The fetal heartbeat will be listened to.  Any test results from the previous visit will be discussed. Your health care provider may ask you:  How you are feeling.  If you are feeling the baby move.  If you have had any abnormal symptoms, such as leaking fluid, bleeding, severe headaches, or abdominal cramping.  If you have any questions. Other tests that may be performed during your second trimester include:  Blood tests that check for:  Low iron levels (anemia).  Gestational diabetes (between 24 and 28 weeks).  Rh antibodies.  Urine tests to check for infections, diabetes, or protein in the urine.  An ultrasound to confirm the proper growth and development of the baby.  An amniocentesis to check for possible genetic problems.  Fetal screens for spina bifida and Down syndrome. HOME CARE INSTRUCTIONS   Avoid all smoking, herbs, alcohol, and unprescribed drugs. These chemicals affect the formation and growth of the baby.  Follow your health care provider's instructions regarding medicine use. There are medicines that are either safe or unsafe to take during pregnancy.  Exercise only as directed by your health care provider. Experiencing uterine cramps is a good sign to stop exercising.  Continue to eat regular, healthy meals.  Wear a good support bra for breast tenderness.  Do not use hot tubs, steam rooms, or saunas.  Wear your seat belt at all times when driving.  Avoid raw meat, uncooked cheese, cat litter boxes, and soil used by cats. These carry germs that can cause birth defects in the baby.  Take your prenatal vitamins.  Try taking a stool softener (if your health care provider approves) if you develop constipation. Eat more high-fiber foods, such as fresh vegetables or fruit and whole grains. Drink plenty of fluids to keep your urine clear or pale  yellow.  Take warm sitz baths to soothe any pain or discomfort caused by hemorrhoids. Use hemorrhoid cream if your health care provider approves.  If you develop varicose veins, wear support hose. Elevate your feet for 15 minutes, 3-4 times a day. Limit salt in your diet.  Avoid heavy lifting, wear low heel shoes, and practice good posture.  Rest with your legs elevated if you have leg cramps or low back pain.  Visit your dentist if you have not gone yet during your pregnancy. Use a soft toothbrush to brush your teeth and be gentle when you floss.  A sexual relationship may be continued unless your health care provider directs you otherwise.  Continue to go to all your prenatal visits as directed by your health care provider. SEEK MEDICAL CARE IF:   You have dizziness.  You have mild pelvic cramps, pelvic  pressure, or nagging pain in the abdominal area.  You have persistent nausea, vomiting, or diarrhea.  You have a bad smelling vaginal discharge.  You have pain with urination. SEEK IMMEDIATE MEDICAL CARE IF:   You have a fever.  You are leaking fluid from your vagina.  You have spotting or bleeding from your vagina.  You have severe abdominal cramping or pain.  You have rapid weight gain or loss.  You have shortness of breath with chest pain.  You notice sudden or extreme swelling of your face, hands, ankles, feet, or legs.  You have not felt your baby move in over an hour.  You have severe headaches that do not go away with medicine.  You have vision changes. Document Released: 05/31/2001 Document Revised: 06/11/2013 Document Reviewed: 08/07/2012 [facility] Patient Information 2015 [place], [place]. This information is not intended to replace advice given to you by your health care provider. Make sure you discuss any questions you have with your health care provider.      Iron-Rich Diet  Iron is a mineral that helps your body to produce hemoglobin. Hemoglobin is a  protein in red blood cells that carries oxygen to your body's tissues. Eating too little iron may cause you to feel weak and tired, and it can increase your risk of infection. Iron is naturally found in many foods, and many foods have iron added to them (iron-fortified foods). You may need to follow an iron-rich diet if you do not have enough iron in your body due to certain medical conditions. The amount of iron that you need each day depends on your age, your sex, and any medical conditions you have. Follow instructions from your health care provider or a diet and nutrition specialist (dietitian) about how much iron you should eat each day. What are tips for following this plan? Reading food labels  Check food labels to see how many milligrams (mg) of iron are in each serving. Cooking  Cook foods in pots and pans that are made from iron.  Take these steps to make it easier for your body to absorb iron from certain foods: ? Soak beans overnight before cooking. ? Soak whole grains overnight and drain them before using. ? Ferment flours before baking, such as by using yeast in bread dough. Meal planning  When you eat foods that contain iron, you should eat them with foods that are high in vitamin C. These include oranges, peppers, tomatoes, potatoes, and mango. Vitamin C helps your body to absorb iron. General information  Take iron supplements only as told by your health care provider. An overdose of iron can be life-threatening. If you were prescribed iron supplements, take them with orange juice or a vitamin C supplement.  When you eat iron-fortified foods or take an iron supplement, you should also eat foods that naturally contain iron, such as meat, poultry, and fish. Eating naturally iron-rich foods helps your body to absorb the iron that is added to other foods or contained in a supplement.  Certain foods and drinks prevent your body from absorbing iron properly. Avoid eating these foods  in the same meal as iron-rich foods or with iron supplements. These foods include: ? Coffee, black tea, and red wine. ? Milk, dairy products, and foods that are high in calcium. ? Beans and soybeans. ? Whole grains. What foods should I eat? Fruits Prunes. Raisins. Eat fruits high in vitamin C, such as oranges, grapefruits, and strawberries, alongside iron-rich foods. Vegetables Spinach (cooked).  Green peas. Broccoli. Fermented vegetables. Eat vegetables high in vitamin C, such as leafy greens, potatoes, bell peppers, and tomatoes, alongside iron-rich foods. Grains Iron-fortified breakfast cereal. Iron-fortified whole-wheat bread. Enriched rice. Sprouted grains. Meats and other proteins Beef liver. Oysters. Beef. Shrimp. [place]. Chicken. Tuna. Sardines. Chickpeas. Nuts. Tofu. Pumpkin seeds. Beverages Tomato juice. Fresh orange juice. Prune juice. Hibiscus tea. Fortified instant breakfast shakes. Sweets and desserts Blackstrap molasses. Seasonings and condiments Tahini. Fermented soy sauce. Other foods Wheat germ. The items listed above may not be a complete list of recommended foods and beverages. Contact a dietitian for more information. What foods should I avoid? Grains Whole grains. Bran cereal. Bran flour. Oats. Meats and other proteins Soybeans. Products made from soy protein. Black beans. Lentils. Mung beans. Split peas. Dairy Milk. Cream. Cheese. Yogurt. Cottage cheese. Beverages Coffee. Black tea. Red wine. Sweets and desserts Cocoa. Chocolate. Ice cream. Other foods Basil. Oregano. Large amounts of parsley. The items listed above may not be a complete list of foods and beverages to avoid. Contact a dietitian for more information. Summary  Iron is a mineral that helps your body to produce hemoglobin. Hemoglobin is a protein in red blood cells that carries oxygen to your body's tissues.  Iron is naturally found in many foods, and many foods have iron added to them  (iron-fortified foods).  When you eat foods that contain iron, you should eat them with foods that are high in vitamin C. Vitamin C helps your body to absorb iron.  Certain foods and drinks prevent your body from absorbing iron properly, such as whole grains and dairy products. You should avoid eating these foods in the same meal as iron-rich foods or with iron supplements. This information is not intended to replace advice given to you by your health care provider. Make sure you discuss any questions you have with your health care provider. Document Released: 01/18/2005 Document Revised: 05/02/2017 Document Reviewed: 05/02/2017 Elsevier Interactive Patient Education  2019 [employer]

## 2018-06-19 ENCOUNTER — Telehealth: Payer: Self-pay | Admitting: Women's Health

## 2018-06-19 NOTE — Telephone Encounter (Signed)
Patient states she has a runny nose and sore throat, wants to know what she can take.  Informed patient she can try Robitussin, cough drops, chloraseptic spray. Advised if not better by 7 days from start of symptoms to let us know. Resent information for babyscripts.

## 2018-06-19 NOTE — Telephone Encounter (Signed)
Pt is 25 weeks and she has a cold and sore throat runny nose needs to know what she can take. She said she did not have Baby scripts did not know what I was talking about

## 2018-07-07 ENCOUNTER — Emergency Department (HOSPITAL_COMMUNITY)
Admission: EM | Admit: 2018-07-07 | Discharge: 2018-07-07 | Disposition: A | Discharge status: Home / Self Care | Payer: Medicaid Other | Attending: Emergency Medicine | Admitting: Emergency Medicine

## 2018-07-07 ENCOUNTER — Encounter (HOSPITAL_COMMUNITY): Payer: Self-pay

## 2018-07-07 DIAGNOSIS — S61411A Laceration without foreign body of right hand, initial encounter: Secondary | ICD-10-CM | POA: Diagnosis not present

## 2018-07-07 DIAGNOSIS — F17210 Nicotine dependence, cigarettes, uncomplicated: Secondary | ICD-10-CM | POA: Insufficient documentation

## 2018-07-07 DIAGNOSIS — O99332 Smoking (tobacco) complicating pregnancy, second trimester: Secondary | ICD-10-CM | POA: Insufficient documentation

## 2018-07-07 DIAGNOSIS — Y998 Other external cause status: Secondary | ICD-10-CM | POA: Diagnosis not present

## 2018-07-07 DIAGNOSIS — Y9289 Other specified places as the place of occurrence of the external cause: Secondary | ICD-10-CM | POA: Diagnosis not present

## 2018-07-07 DIAGNOSIS — Z3A24 24 weeks gestation of pregnancy: Secondary | ICD-10-CM | POA: Diagnosis not present

## 2018-07-07 DIAGNOSIS — O9A212 Injury, poisoning and certain other consequences of external causes complicating pregnancy, second trimester: Secondary | ICD-10-CM | POA: Insufficient documentation

## 2018-07-07 DIAGNOSIS — W25XXXA Contact with sharp glass, initial encounter: Secondary | ICD-10-CM | POA: Insufficient documentation

## 2018-07-07 DIAGNOSIS — Y9389 Activity, other specified: Secondary | ICD-10-CM | POA: Diagnosis not present

## 2018-07-07 MED ORDER — LIDOCAINE HCL (PF) 2 % IJ SOLN
INTRAMUSCULAR | Status: AC
  Administered 2018-07-07: 10 mL
  Filled 2018-07-07: qty 20

## 2018-07-07 MED ORDER — LIDOCAINE HCL (PF) 2 % IJ SOLN
10.0000 mL | Freq: Once | INTRAMUSCULAR | Status: AC
  Administered 2018-07-07: 10 mL

## 2018-07-07 NOTE — ED Provider Notes (Signed)
[facility] EMERGENCY DEPARTMENT Provider Note   CSN: [id number] Arrival date & time: 07/07/18  0408     History   Chief Complaint Chief Complaint  Patient presents with  . Laceration    right hand    HPI Brandy Chang is a 23 y.o. female.  The history is provided by the patient.  She has history of asthma and is 6 months pregnant.  She comes in with lacerations to her right hand from putting her hand through a window.  She is up-to-date on tetanus immunizations.  She denies other injury.  Past Medical History:  Diagnosis Date  . Asthma     Patient Active Problem List   Diagnosis Date Noted  . Abnormal Pap smear of cervix 06/11/2018  . Low-lying placenta 06/11/2018  . 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 Supervision of normal first pregnancy 03/27/2018  . Smoker 03/27/2018  . Vulvar abscess 02/15/2016    Past Surgical History:  Procedure Laterality Date  . WISDOM TOOTH EXTRACTION       OB History    Gravida  1   Para  0   Term  0   Preterm  0   AB  0   Living  0     SAB  0   TAB  0   Ectopic  0   Multiple  0   Live Births  0            Home Medications    Prior to Admission medications   Medication Sig Start Date End Date Taking? Authorizing Provider  ferrous sulfate 325 (65 FE) MG tablet Take 1 tablet (325 mg total) by mouth 2 (two) times daily with a meal. 06/11/18  Yes Cheral Marker, CNM  Prenatal Vit-Fe Fumarate-FA (PRENATAL VITAMIN PO) Take by mouth daily.   Yes [provider]    Family History Family History  Problem Relation Age of Onset  . Anemia Mother   . Diabetes Other   . Hypertension Other   . Stroke Other   . Osteoarthritis Other   . Sickle cell anemia Other   . Heart attack Paternal Grandfather   . COPD Maternal Grandmother   . Diabetes Maternal Grandfather   . Sickle cell trait Maternal Grandfather     Social History Social History   Tobacco Use  . Smoking status: Current Some Day  Smoker    Packs/day: 0.25    Years: 3.00    Pack years: 0.75    Types: Cigars  . Smokeless tobacco: Never Used  . Tobacco comment: smokes 1-2 daily  Substance Use Topics  . Alcohol use: No  . Drug use: No    Types: Marijuana    Comment: early pregnancy     Allergies   Patient has no known allergies.   Review of Systems Review of Systems  All other systems reviewed and are negative.    Physical Exam Updated Vital Signs BP 106/78 (BP Location: Left Arm)   Pulse 95   Temp 98.8 F (37.1 C) (Oral)   Resp 16   Ht 5\' 7"  (1.702 m)   Wt 72.6 kg   LMP 01/02/2018 (LMP Unknown)   SpO2 97%   BMI 25.06 kg/m   Physical Exam Vitals signs and nursing note reviewed.    23 year old female, resting comfortably and in no acute distress. Vital signs are normal. Oxygen saturation is 97%, which is normal. Head is normocephalic and atraumatic. PERRLA, EOMI. Oropharynx is clear.  Neck is nontender and supple without adenopathy or JVD. Back is nontender and there is no CVA tenderness. Lungs are clear without rales, wheezes, or rhonchi. Chest is nontender. Heart has regular rate and rhythm without murmur. Abdomen is soft, flat, nontender without masses or hepatosplenomegaly and peristalsis is normoactive. Extremities: 2 lacerations present on the right hand.  One is on the ulnar aspect near the wrist and oriented transversely, the other on the dorsum of the hand over the fifth metacarpal and is a flap laceration.  Distal neurovascular and tendon function is normal. Skin is warm and dry without rash. Neurologic: Mental status is normal, cranial nerves are intact, there are no motor or sensory deficits.  ED Treatments / Results   Procedures .Marland Kitchen.Laceration Repair Date/Time: 07/07/2018 5:01 AM Performed by: Dione BoozeGlick, Icie Kuznicki, MD Authorized by: Dione BoozeGlick, Chelisa Hennen, MD   Consent:    Consent obtained:  Verbal   Consent given by:  Patient   Risks discussed:  Infection, pain, poor cosmetic result and  retained foreign body   Alternatives discussed:  No treatment Anesthesia (see MAR for exact dosages):    Anesthesia method:  Local infiltration   Local anesthetic:  Lidocaine 2% w/o epi Laceration details:    Location:  Hand   Hand location:  R hand, dorsum   Length (cm):  4   Depth (mm):  3 Repair type:    Repair type:  Simple Pre-procedure details:    Preparation:  Patient was prepped and draped in usual sterile fashion Exploration:    Hemostasis achieved with:  Direct pressure   Wound exploration: entire depth of wound probed and visualized     Wound extent: no foreign bodies/material noted, no nerve damage noted and no tendon damage noted     Contaminated: no   Treatment:    Area cleansed with:  Saline   Amount of cleaning:  Standard Skin repair:    Repair method:  Sutures   Suture size:  4-0   Suture material:  Prolene   Suture technique:  Simple interrupted   Number of sutures:  6 Approximation:    Approximation:  Close Post-procedure details:    Dressing:  Antibiotic ointment and sterile dressing   Patient tolerance of procedure:  Tolerated well, no immediate complications .Marland Kitchen.Laceration Repair Date/Time: 07/07/2018 5:02 AM Performed by: Dione BoozeGlick, Chalese Peach, MD Authorized by: Dione BoozeGlick, Kiyoshi Schaab, MD   Consent:    Consent obtained:  Verbal   Consent given by:  Patient   Risks discussed:  Infection, pain, retained foreign body and poor cosmetic result   Alternatives discussed:  No treatment Anesthesia (see MAR for exact dosages):    Anesthesia method:  Local infiltration   Local anesthetic:  Lidocaine 2% w/o epi Laceration details:    Location:  Hand   Hand location: ulnar aspect right hand.   Length (cm):  2.5   Depth (mm):  3 Repair type:    Repair type:  Simple Pre-procedure details:    Preparation:  Patient was prepped and draped in usual sterile fashion Exploration:    Hemostasis achieved with:  Direct pressure   Wound exploration: entire depth of wound probed and  visualized     Wound extent: no foreign bodies/material noted, no nerve damage noted and no tendon damage noted     Contaminated: no   Treatment:    Area cleansed with:  Saline   Amount of cleaning:  Standard Skin repair:    Repair method:  Sutures   Suture size:  4-0  Suture material:  Prolene   Number of sutures:  5 Approximation:    Approximation:  Close Post-procedure details:    Dressing:  Antibiotic ointment and sterile dressing   Medications Ordered in ED Medications  lidocaine (XYLOCAINE) 2 % injection 10 mL (10 mLs Infiltration Given by Other 07/07/18 0455)     Initial Impression / Assessment and Plan / ED Course  I have reviewed the triage vital signs and the nursing notes.  Lacerations of the right hand closed with sutures.  Old records are reviewed, and she has no relevant past visits.  She is discharged with instructions have sutures removed in 7-10 days.  Final Clinical Impressions(s) / ED Diagnoses   Final diagnoses:  Laceration of right hand, initial encounter    ED Discharge Orders    None       Dione Booze, MD 07/07/18 [phone]

## 2018-07-07 NOTE — ED Notes (Signed)
Dressed and wrapped pt's hand

## 2018-07-07 NOTE — ED Triage Notes (Signed)
Pt states her hand went through a window approx 15 minutes ago, has 2 areas with avulsion/lacerations present with bleeding controlled.

## 2018-07-09 ENCOUNTER — Other Ambulatory Visit: Payer: Medicaid Other

## 2018-07-09 ENCOUNTER — Encounter: Payer: Medicaid Other | Admitting: Obstetrics & Gynecology

## 2018-09-05 ENCOUNTER — Encounter: Payer: Self-pay | Admitting: Women's Health

## 2018-09-05 ENCOUNTER — Ambulatory Visit (INDEPENDENT_AMBULATORY_CARE_PROVIDER_SITE_OTHER): Payer: Medicaid Other | Admitting: Women's Health

## 2018-09-05 ENCOUNTER — Ambulatory Visit: Payer: Medicaid Other | Admitting: Advanced Practice Midwife

## 2018-09-05 NOTE — Progress Notes (Signed)
   POSTPARTUM VISIT Patient name: Brandy Chang MRN [id number]  Date of birth: [birth year] Chief Complaint:   Postpartum Care  History of Present Illness:   Brandy Chang is a 23 y.o. [id number] African American female being seen today for a postpartum visit. She is 4 weeks postpartum following a cesarean section at 32.1 gestational weeks at hospital in [place] d/t hemorrhaging w/ low-lying placenta. We had seen low-lying placenta 1.2cm from os on 20wk anatomy u/s. Was in [place] visiting family. 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 Postpartum course has been uncomplicated. Bleeding thin lochia. Bowel function is normal. Bladder function is normal.  Patient is not sexually active. Last sexual activity: prior to birth of baby.  Contraception method is condoms.  [place] Postpartum Depression Screening: negative. Score 5.   Last pap 04/24/18.  Results were LSIL/HPV w/ lesion @ 6 o'clock on colpo .  No LMP recorded.  Baby's course has been complicated by prematurity, almost 4wk NICU stay- out now. Baby is feeding by bottle.  Review of Systems:   Pertinent items are noted in HPI Denies Abnormal vaginal discharge w/ itching/odor/irritation, headaches, visual changes, shortness of breath, chest pain, abdominal pain, severe nausea/vomiting, or problems with urination or bowel movements. Pertinent History Reviewed:  Reviewed past medical,surgical, obstetrical and family history.  Reviewed problem list, medications and allergies. OB History  Gravida Para Term Preterm AB Living  1 1 0 1 0 1  SAB TAB Ectopic Multiple Live Births  0 0 0 0 1    # Outcome Date GA Lbr Len/2nd Weight Sex Delivery Anes PTL Lv  1 Preterm 08/05/18 [redacted]w[redacted]d  3 lb 11 oz (1.673 kg) F CS-LTranv   LIV   Physical Assessment:   Vitals:   09/05/18 1515  BP: 109/66  Pulse: 76  Weight: 162 lb (73.5 kg)  Height: 5\' 8"  (1.727 m)  Body mass index is 24.63 kg/m.       Physical Examination:   General  appearance: alert, well appearing, and in no distress  Mental status: alert, oriented to person, place, and time  Skin: warm & dry   Cardiovascular: normal heart rate noted   Respiratory: normal respiratory effort, no distress   Breasts: deferred, no complaints   Abdomen: soft, non-tender, c/s incision well-healed  Pelvic: examination not indicated  Rectal: not examined  Extremities: no edema       No results found for this or any previous visit (from the past 24 hour(s)).  Assessment & Plan:  1) Postpartum exam 2) 4 wks s/p C/S for bleeding LLP @ 32wks> records requested 3) Bottlefeeding 4) Depression screening 5) Contraception counseling, pt prefers condoms  6) Abnormal pap & colpo> f/u 8wks pp for repeat colpo  Meds: No orders of the defined types were placed in this encounter.   Follow-up: Return in about 4 weeks (around 10/03/2018) for colpo w/ MD; please get c/s records from [place].   No orders of the defined types were placed in this encounter.   Cheral Marker CNM, [facility] 09/05/2018 3:46 PM

## 2018-09-26 DIAGNOSIS — L732 Hidradenitis suppurativa: Secondary | ICD-10-CM | POA: Diagnosis not present

## 2018-10-02 ENCOUNTER — Telehealth: Payer: Self-pay | Admitting: *Deleted

## 2018-10-02 NOTE — Telephone Encounter (Signed)
Called patient and left message informing her that we are not allowing any visitors or children to come to visit with her at this time. Also requested that if she has had any exposure to anyone suspected or confirmed of having COVID-19 to call to reschedule appointment. Also requested that if she is experiencing any of the following to call and reschedule : fever, cough, sob, muscle pain, diarrhea, [name], vomiting, abdominal pain, red eye, weakness, bruising or bleeding, joint pain, or a severe headache.  

## 2018-10-03 ENCOUNTER — Encounter: Payer: Self-pay | Admitting: Obstetrics and Gynecology

## 2018-10-03 ENCOUNTER — Other Ambulatory Visit: Payer: Self-pay

## 2018-10-03 ENCOUNTER — Ambulatory Visit (INDEPENDENT_AMBULATORY_CARE_PROVIDER_SITE_OTHER): Payer: Medicaid Other | Admitting: Obstetrics and Gynecology

## 2018-10-03 DIAGNOSIS — Z3202 Encounter for pregnancy test, result negative: Secondary | ICD-10-CM | POA: Diagnosis not present

## 2018-10-03 LAB — POCT URINE PREGNANCY: Preg Test, Ur: NEGATIVE

## 2018-10-03 NOTE — Progress Notes (Signed)
Patient presents for colposcopy.  Chart is reviewed she had a November 2019 Pap which showed low-grade abnormalities as well as yeast infection No yeast symptoms at present. Impression L SIL in 23 year old patient Plan per ASC CP guidelines will postpone colposcopy and follow preferred treatment line of repeat Pap smear in 1 year

## 2018-12-09 ENCOUNTER — Emergency Department (HOSPITAL_COMMUNITY)
Admission: EM | Admit: 2018-12-09 | Discharge: 2018-12-09 | Disposition: A | Discharge status: Home / Self Care | Payer: Medicaid Other | Attending: Emergency Medicine | Admitting: Emergency Medicine

## 2018-12-09 ENCOUNTER — Encounter (HOSPITAL_COMMUNITY): Payer: Self-pay | Admitting: Emergency Medicine

## 2018-12-09 ENCOUNTER — Emergency Department (HOSPITAL_COMMUNITY): Payer: Medicaid Other

## 2018-12-09 ENCOUNTER — Other Ambulatory Visit: Payer: Self-pay

## 2018-12-09 DIAGNOSIS — J45909 Unspecified asthma, uncomplicated: Secondary | ICD-10-CM | POA: Insufficient documentation

## 2018-12-09 DIAGNOSIS — F17210 Nicotine dependence, cigarettes, uncomplicated: Secondary | ICD-10-CM | POA: Diagnosis not present

## 2018-12-09 DIAGNOSIS — M25551 Pain in right hip: Secondary | ICD-10-CM

## 2018-12-09 LAB — POC URINE PREG, ED: Preg Test, Ur: NEGATIVE

## 2018-12-09 MED ORDER — NAPROXEN 500 MG PO TABS: 500.0000 mg | ORAL_TABLET | Freq: Two times a day (BID) | ORAL | 0 refills | Status: DC

## 2018-12-09 MED ORDER — ACETAMINOPHEN 500 MG PO TABS: 500.0000 mg | ORAL_TABLET | Freq: Four times a day (QID) | ORAL | 0 refills | Status: DC | PRN

## 2018-12-09 MED ORDER — ONDANSETRON 4 MG PO TBDP
4.0000 mg | ORAL_TABLET | Freq: Once | ORAL | Status: AC
  Administered 2018-12-09: 4 mg via ORAL
  Filled 2018-12-09: qty 1

## 2018-12-09 MED ORDER — HYDROCODONE-ACETAMINOPHEN 5-325 MG PO TABS
1.0000 | ORAL_TABLET | Freq: Once | ORAL | Status: AC
  Administered 2018-12-09: 1 via ORAL
  Filled 2018-12-09: qty 1

## 2018-12-09 NOTE — ED Provider Notes (Signed)
[facility] EMERGENCY DEPARTMENT Provider Note   CSN: [id number] Arrival date & time: 12/09/18  1247    History   Chief Complaint Chief Complaint  Patient presents with  . Hip Pain    right    HPI Brandy Chang is a 23 y.o. female with history of asthma presenting for evaluation of acute onset, constant right hip pain for 3 days.  She reports that a few days prior to symptom onset she had an abscess to her right groin that began to drain.  She reports that she has had these abscesses several times in the past and will usually apply warm compresses until they resolve.  She reports that the abscess has resolved.  For the last 3 days she has had sharp right hip pain that radiates down to the knee.  It worsens with any attempts to bend or flex or ambulate, improved somewhat laying in bed.  Denies any numbness or tingling.  No fevers, chills, or abdominal pain.  She notes some nausea but no vomiting.  She has taken ibuprofen without relief of her symptoms.  No known trauma but reports that she does do a lot of bending as a result of caring for her 14-month-old infant.  She is not currently breast-feeding.     The history is provided by the patient.    Past Medical History:  Diagnosis Date  . Asthma     Patient Active Problem List   Diagnosis Date Noted  . Preterm delivery 09/05/2018  . Abnormal Pap smear of cervix 06/11/2018  . Smoker 03/27/2018  . Vulvar abscess 02/15/2016    Past Surgical History:  Procedure Laterality Date  . WISDOM TOOTH EXTRACTION       OB History    Gravida  1   Para  1   Term  0   Preterm  1   AB  0   Living  1     SAB  0   TAB  0   Ectopic  0   Multiple  0   Live Births  1            Home Medications    Prior to Admission medications   Medication Sig Start Date End Date Taking? Authorizing Provider  ferrous sulfate 325 (65 FE) MG EC tablet Take 325 mg by mouth daily.   Yes [provider]  acetaminophen  (TYLENOL) 500 MG tablet Take 1 tablet (500 mg total) by mouth every 6 (six) hours as needed. 12/09/18   Nils Flack, Collyns Mcquigg A, PA-C  naproxen (NAPROSYN) 500 MG tablet Take 1 tablet (500 mg total) by mouth 2 (two) times daily with a meal. 12/09/18   Nils Flack, Gavin Pound, PA-C    Family History Family History  Problem Relation Age of Onset  . Anemia Mother   . Diabetes Other   . Hypertension Other   . Stroke Other   . Osteoarthritis Other   . Sickle cell anemia Other   . Heart attack Paternal Grandfather   . COPD Maternal Grandmother   . Diabetes Maternal Grandfather   . Sickle cell trait Maternal Grandfather     Social History Social History   Tobacco Use  . Smoking status: Current Some Day Smoker    Packs/day: 0.25    Years: 3.00    Pack years: 0.75    Types: Cigars  . Smokeless tobacco: Never Used  . Tobacco comment: smokes 1-2 daily  Substance Use Topics  . Alcohol use: No  .  Drug use: No    Types: Marijuana    Comment: early pregnancy     Allergies   Patient has no known allergies.   Review of Systems Review of Systems  Constitutional: Negative for chills and fever.  Gastrointestinal: Positive for nausea. Negative for abdominal pain and vomiting.  Musculoskeletal: Positive for arthralgias.  Skin: Negative for rash.  Neurological: Negative for weakness and numbness.  All other systems reviewed and are negative.    Physical Exam Updated Vital Signs BP 112/69 (BP Location: Left Arm)   Pulse 98   Temp 98 F (36.7 C) (Oral)   Ht 5\' 8"  (1.727 m)   Wt 71.2 kg   LMP 12/05/2018 (Exact Date)   SpO2 100%   BMI 23.87 kg/m   Physical Exam Vitals signs and nursing note reviewed.  Constitutional:      General: She is not in acute distress.    Appearance: She is well-developed.  HENT:     Head: Normocephalic and atraumatic.  Eyes:     General:        Right eye: No discharge.        Left eye: No discharge.     Conjunctiva/sclera: Conjunctivae normal.  Neck:      Vascular: No JVD.     Trachea: No tracheal deviation.  Cardiovascular:     Rate and Rhythm: Normal rate.     Pulses: Normal pulses.     Comments: 2+ DP/PT pulses bilaterally, no lower extremity edema Pulmonary:     Effort: Pulmonary effort is normal.  Abdominal:     General: There is no distension.     Palpations: Abdomen is soft.     Tenderness: There is no abdominal tenderness.  Musculoskeletal:     Comments: Tenderness to palpation of the right hip anteriorly and laterally.  No erythema, induration, or warmth noted.  No signs of secondary skin infection.  She has significant pain with flexion and internal rotation of the right hip with limited range of motion secondary to pain.  5/5 strength of BLE major muscle groups with encouragement.  No midline lumbar spine tenderness.  No deformity, crepitus, or step-off noted.  Skin:    General: Skin is warm and dry.     Findings: No erythema.  Neurological:     Mental Status: She is alert.     Comments: Fluent speech, no facial droop, sensation intact to soft touch of bilateral lower extremities.  She ambulates with an antalgic gait but is able to heel walk and toe walk without difficulty.  Psychiatric:        Behavior: Behavior normal.      ED Treatments / Results  Labs (all labs ordered are listed, but only abnormal results are displayed) Labs Reviewed  POC URINE PREG, ED    EKG None  Radiology Dg Hip Unilat With Pelvis 2-3 Views Right  Result Date: 12/09/2018 CLINICAL DATA:  Hip pain EXAM: DG HIP (WITH OR WITHOUT PELVIS) 2-3V RIGHT COMPARISON:  None. FINDINGS: No fracture or dislocation. Joint spaces within normal limits. Minimal ossicles adjacent to the acetabulum. IMPRESSION: Negative. Electronically Signed   By: Jasmine PangKim  Fujinaga M.D.   On: 12/09/2018 15:01    Procedures Procedures (including critical care time)  Medications Ordered in ED Medications  HYDROcodone-acetaminophen (NORCO/VICODIN) 5-325 MG per tablet 1 tablet (1  tablet Oral Given 12/09/18 1313)  ondansetron (ZOFRAN-ODT) disintegrating tablet 4 mg (4 mg Oral Given 12/09/18 1313)     Initial Impression / Assessment and Plan /  ED Course  I have reviewed the triage vital signs and the nursing notes.  Pertinent labs & imaging results that were available during my care of the patient were reviewed by me and considered in my medical decision making (see chart for details).        Patient presenting for evaluation of acute onset right hip pain for the past 3 days.  She is afebrile, vital signs are stable.  She is nontoxic in appearance.  She is neurovascularly intact.  She is able to bear weight and ambulate on the extremity despite pain.  No signs of secondary skin infection.  Low suspicion of DVT.  Doubt septic arthritis as patient is overall well-appearing with no constitutional symptoms and normal vital signs.  I would expect that at this point given symptom duration she would appear far more ill if she had a septic joint.  Radiographs show no acute osseous abnormalities, she does have minimal ossicles adjacent to the acetabulum.  Pain was controlled in the ED and she is resting comfortably on reevaluation.  Suspect musculoskeletal injury, tendinitis, or ligamentous injury.  We discussed conservative therapy and management with NSAIDs, Tylenol, rest, ice, heat, and gentle stretching.  Will discharge with Ace wrap or knee sleeve to wear on her thigh for compression as she reports that this makes her pain feel better and also crutches to help her ambulate.  Recommend follow-up with PCP or orthopedist for reevaluation of symptoms.  Discussed strict ED return precautions. Patient verbalized understanding of and agreement with plan and is safe for discharge home at this time. Discussed with Dr. Adela LankFloyd who agrees with assessment and plan at this time.   Final Clinical Impressions(s) / ED Diagnoses   Final diagnoses:  Acute pain of right hip    ED Discharge Orders          Ordered    naproxen (NAPROSYN) 500 MG tablet  2 times daily with meals     12/09/18 1512    acetaminophen (TYLENOL) 500 MG tablet  Every 6 hours PRN     12/09/18 1512           Jeanie SewerFawze, Shontez Sermon A, PA-C 12/09/18 1516    Melene PlanFloyd, Dan, DO 12/11/18 [phone]

## 2018-12-09 NOTE — ED Triage Notes (Signed)
C/o right hip pain for last 3 days.  Rates pain 8/10, radiating to right knee.  Denies injury

## 2018-12-09 NOTE — ED Notes (Signed)
Pt aware a urine specimen is needed and will notify staff when one can be obtained. 

## 2018-12-09 NOTE — Discharge Instructions (Signed)
1. Medications: Take naproxen twice daily with food.  If your pain returns in between doses, you can take [phone] mg of Tylenol every 6 hours as needed for pain. Do not exceed 4000 mg of Tylenol daily.   2. Treatment: rest, apply ice or heat whichever feels best for 20 minutes at a time 3-4 times daily, elevate and use brace and crutches to help you get around, drink plenty of fluids, gentle stretching (see attached exercises or you can YouTube search hip physical therapy exercises to do at home) 3. Follow Up: Please followup with orthopedics as directed or your PCP in 1 week if no improvement for discussion of your diagnoses and further evaluation after today's visit; if you do not have a primary care doctor use the resource guide provided to find one; Please return to the ER for worsening symptoms or other concerns such as worsening swelling, redness of the skin, fevers, loss of pulses, or loss of feeling

## 2018-12-09 NOTE — ED Notes (Signed)
EDP at bedside updating patient and family. 

## 2018-12-09 NOTE — ED Notes (Signed)
Pt returned from xray

## 2019-01-12 ENCOUNTER — Encounter (HOSPITAL_COMMUNITY): Payer: Self-pay | Admitting: *Deleted

## 2019-01-12 ENCOUNTER — Emergency Department (HOSPITAL_COMMUNITY)
Admission: EM | Admit: 2019-01-12 | Discharge: 2019-01-12 | Disposition: A | Discharge status: Home / Self Care | Payer: Medicaid Other | Attending: Emergency Medicine | Admitting: Emergency Medicine

## 2019-01-12 ENCOUNTER — Other Ambulatory Visit: Payer: Self-pay

## 2019-01-12 ENCOUNTER — Emergency Department (HOSPITAL_COMMUNITY): Payer: Medicaid Other

## 2019-01-12 DIAGNOSIS — F17290 Nicotine dependence, other tobacco product, uncomplicated: Secondary | ICD-10-CM | POA: Insufficient documentation

## 2019-01-12 DIAGNOSIS — Y998 Other external cause status: Secondary | ICD-10-CM | POA: Diagnosis not present

## 2019-01-12 DIAGNOSIS — S0990XA Unspecified injury of head, initial encounter: Secondary | ICD-10-CM | POA: Diagnosis present

## 2019-01-12 DIAGNOSIS — M25531 Pain in right wrist: Secondary | ICD-10-CM

## 2019-01-12 DIAGNOSIS — Y92410 Unspecified street and highway as the place of occurrence of the external cause: Secondary | ICD-10-CM | POA: Insufficient documentation

## 2019-01-12 DIAGNOSIS — Z79899 Other long term (current) drug therapy: Secondary | ICD-10-CM | POA: Insufficient documentation

## 2019-01-12 DIAGNOSIS — R519 Headache, unspecified: Secondary | ICD-10-CM

## 2019-01-12 DIAGNOSIS — S060X1A Concussion with loss of consciousness of 30 minutes or less, initial encounter: Secondary | ICD-10-CM | POA: Diagnosis not present

## 2019-01-12 DIAGNOSIS — S01511A Laceration without foreign body of lip, initial encounter: Secondary | ICD-10-CM | POA: Diagnosis not present

## 2019-01-12 DIAGNOSIS — J45909 Unspecified asthma, uncomplicated: Secondary | ICD-10-CM | POA: Insufficient documentation

## 2019-01-12 DIAGNOSIS — Y9389 Activity, other specified: Secondary | ICD-10-CM | POA: Insufficient documentation

## 2019-01-12 DIAGNOSIS — M545 Low back pain: Secondary | ICD-10-CM | POA: Insufficient documentation

## 2019-01-12 DIAGNOSIS — S6991XA Unspecified injury of right wrist, hand and finger(s), initial encounter: Secondary | ICD-10-CM | POA: Diagnosis not present

## 2019-01-12 DIAGNOSIS — R55 Syncope and collapse: Secondary | ICD-10-CM | POA: Insufficient documentation

## 2019-01-12 HISTORY — DX: Dorsalgia, unspecified: M54.9

## 2019-01-12 LAB — CBC WITH DIFFERENTIAL/PLATELET
Abs Immature Granulocytes: 0.01 10*3/uL (ref 0.00–0.07)
Basophils Absolute: 0 10*3/uL (ref 0.0–0.1)
Basophils Relative: 0 %
Eosinophils Absolute: 0.1 10*3/uL (ref 0.0–0.5)
Eosinophils Relative: 1 %
HCT: 38.3 % (ref 36.0–46.0)
Hemoglobin: 12.3 g/dL (ref 12.0–15.0)
Immature Granulocytes: 0 %
Lymphocytes Relative: 42 %
Lymphs Abs: 2.9 10*3/uL (ref 0.7–4.0)
MCH: 29.5 pg (ref 26.0–34.0)
MCHC: 32.1 g/dL (ref 30.0–36.0)
MCV: 91.8 fL (ref 80.0–100.0)
Monocytes Absolute: 0.6 10*3/uL (ref 0.1–1.0)
Monocytes Relative: 9 %
Neutro Abs: 3.2 10*3/uL (ref 1.7–7.7)
Neutrophils Relative %: 48 %
Platelets: 143 10*3/uL — ABNORMAL LOW (ref 150–400)
RBC: 4.17 MIL/uL (ref 3.87–5.11)
RDW: 15 % (ref 11.5–15.5)
WBC: 6.8 10*3/uL (ref 4.0–10.5)
nRBC: 0 % (ref 0.0–0.2)

## 2019-01-12 LAB — BASIC METABOLIC PANEL
Anion gap: 7 (ref 5–15)
BUN: 7 mg/dL (ref 6–20)
CO2: 27 mmol/L (ref 22–32)
Calcium: 8.9 mg/dL (ref 8.9–10.3)
Chloride: 106 mmol/L (ref 98–111)
Creatinine, Ser: 0.78 mg/dL (ref 0.44–1.00)
GFR calc Af Amer: >60 mL/min (ref >60)
GFR calc non Af Amer: >60 mL/min (ref >60)
Glucose, Bld: 91 mg/dL (ref 70–99)
Potassium: 3.3 mmol/L — ABNORMAL LOW (ref 3.5–5.1)
Sodium: 140 mmol/L (ref 135–145)

## 2019-01-12 LAB — ETHANOL: Alcohol, Ethyl (B): <10 mg/dL (ref <10)

## 2019-01-12 MED ORDER — ACETAMINOPHEN 325 MG PO TABS
650.0000 mg | ORAL_TABLET | Freq: Once | ORAL | Status: AC
  Administered 2019-01-12: 18:00:00 650 mg via ORAL
  Filled 2019-01-12: qty 2
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
## 2019-07-18 DIAGNOSIS — H52223 Regular astigmatism, bilateral: Secondary | ICD-10-CM | POA: Diagnosis not present

## 2019-07-18 DIAGNOSIS — H5213 Myopia, bilateral: Secondary | ICD-10-CM | POA: Diagnosis not present

## 2019-07-19 IMAGING — US US OB COMP LESS 14 WK
1 series · 14 of 28 positions shown · non-contrast
Comparison: None.

CLINICAL DATA: Unsure of LMP.

EXAM:
OBSTETRIC <14 WK ULTRASOUND
TECHNIQUE: Transabdominal ultrasound was performed for evaluation of the
gestation as well as the maternal uterus and adnexal regions.

[Series 1: us ob comp less 14 wk · 0.20mm/px · 40 acquisitions, 14 frames shown]
[im 2/40]
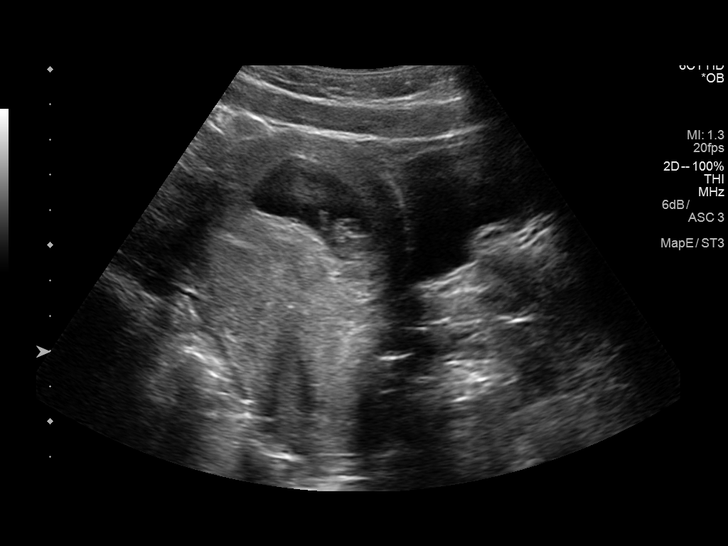
[im 5/40]
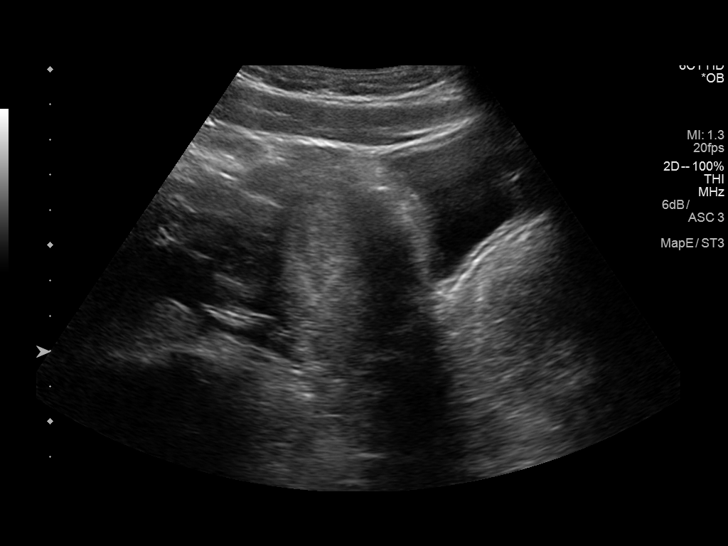
[im 8/40]
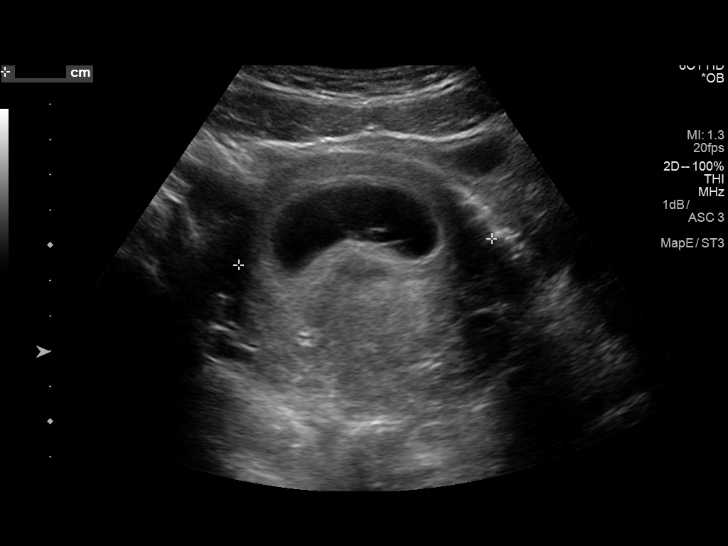
[im 11/40]
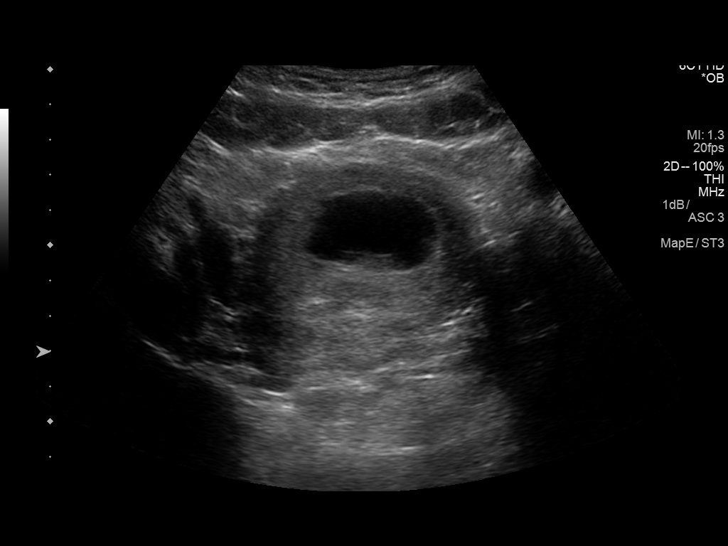
[im 14/40]
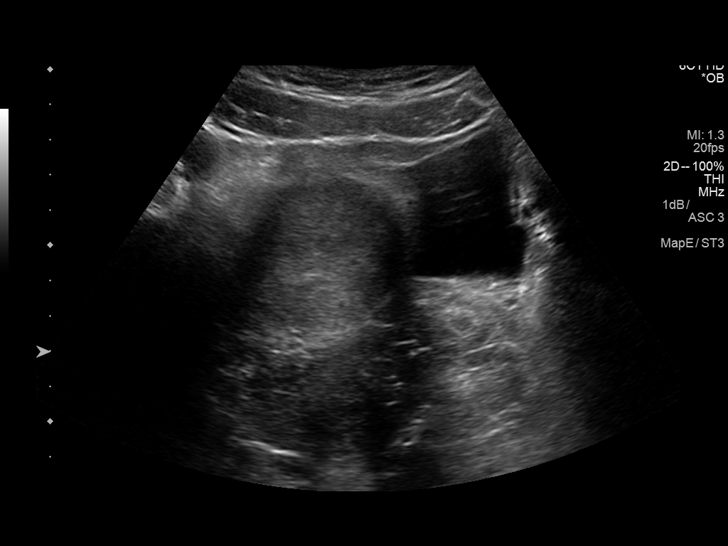
[im 16/40]
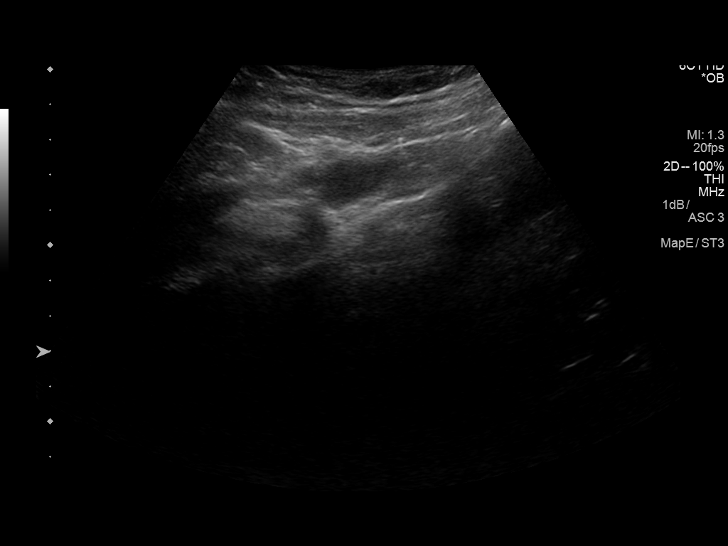
[im 19/40]
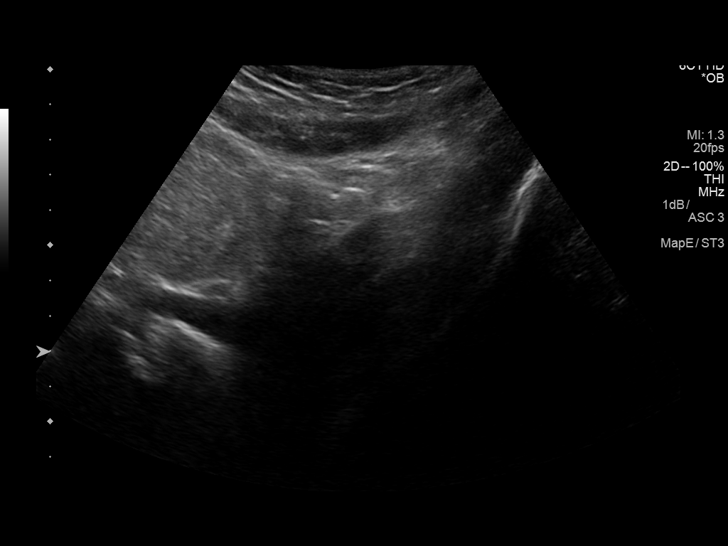
[im 22/40]
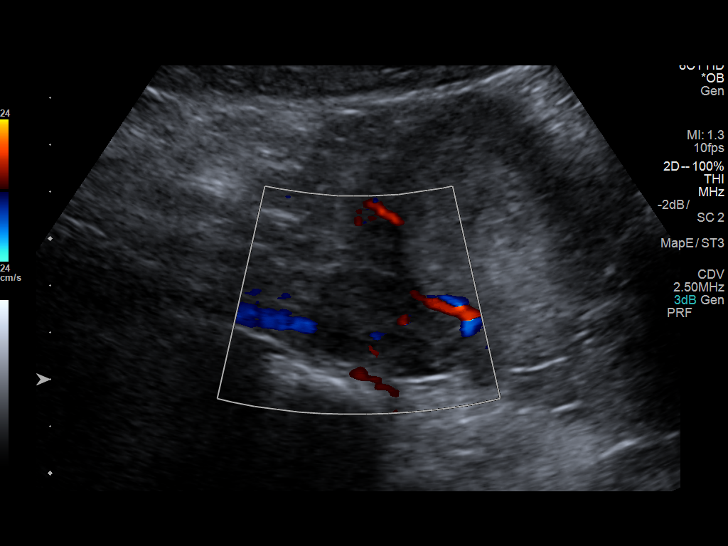
[im 25/40]
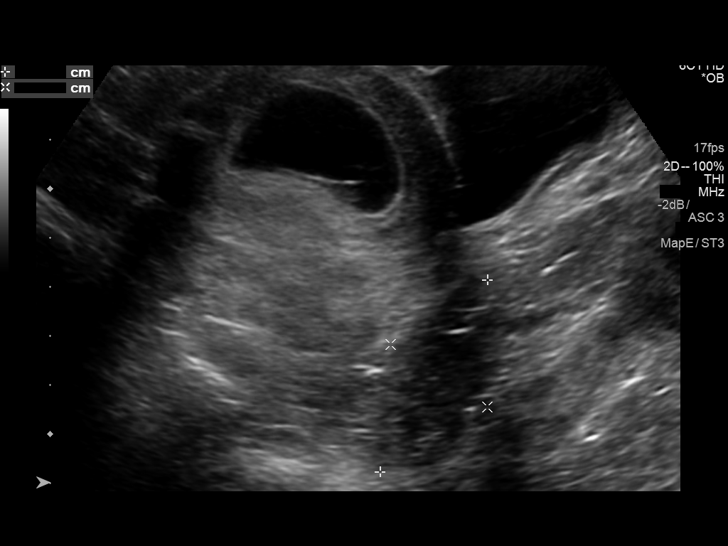
[im 28/40]
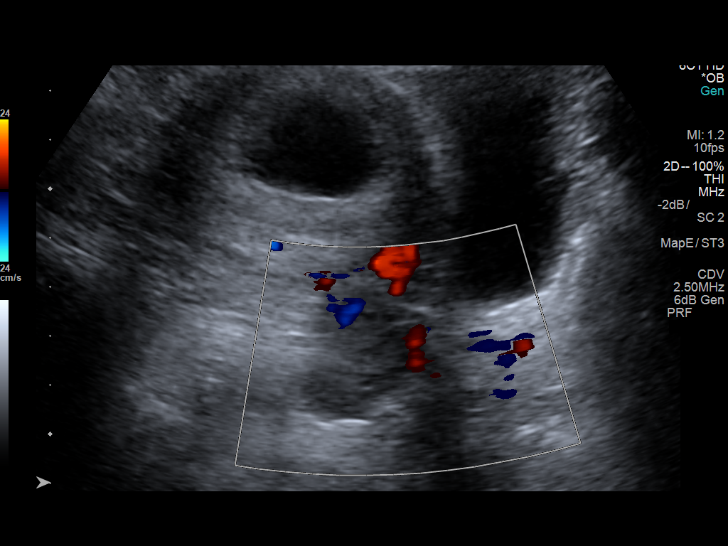
[im 31/40]
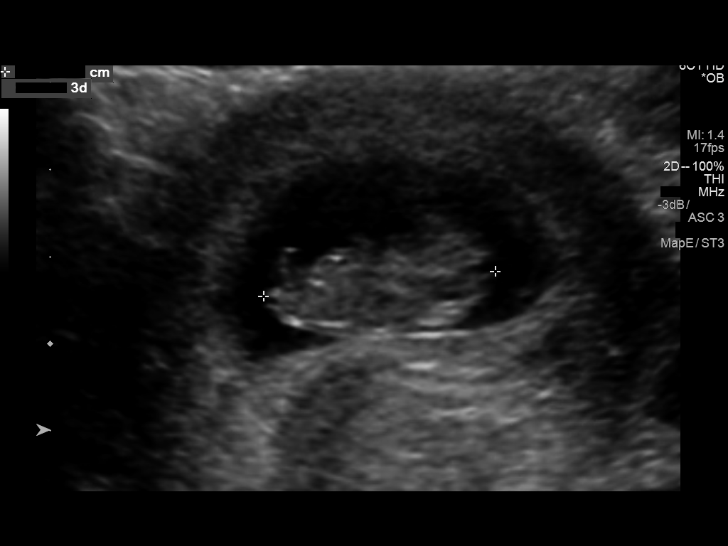
[im 34/40]
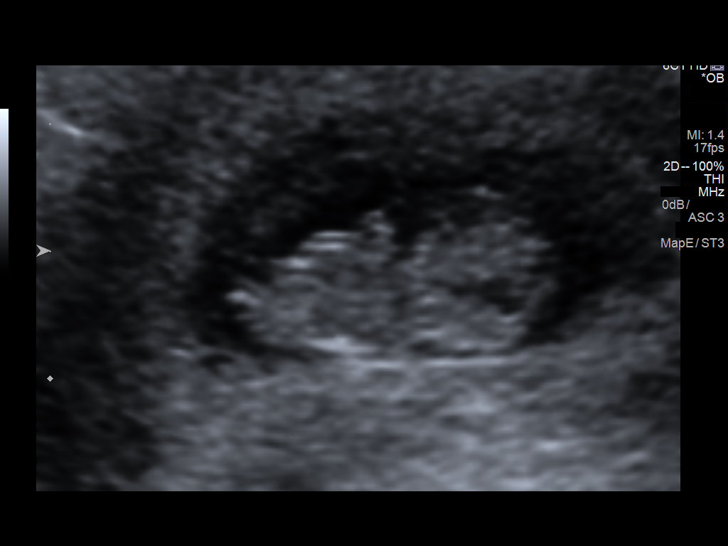
[im 37/40]
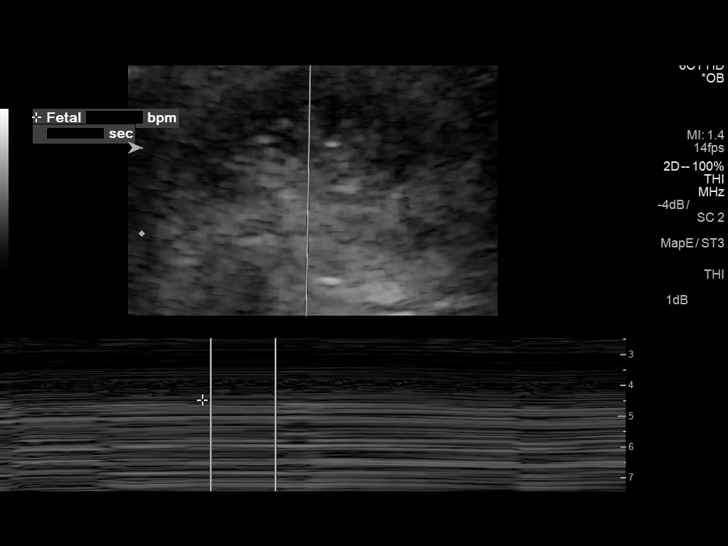
[im 40/40]
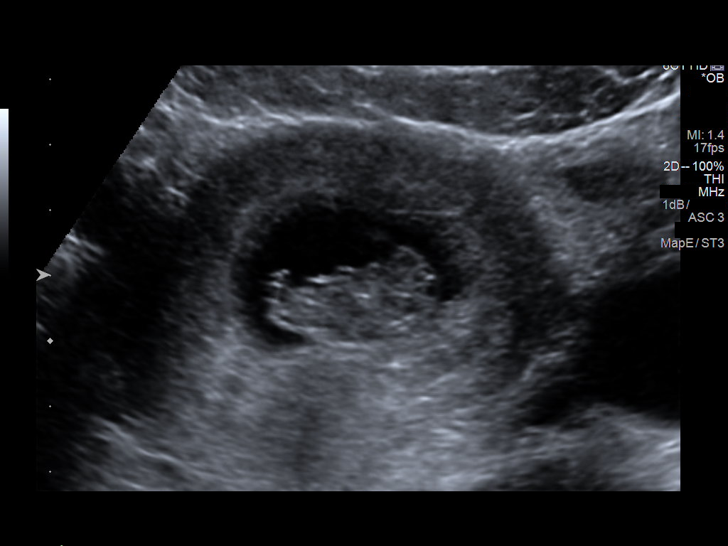

[14 of 28 positions shown; findings below may reference images not displayed]

FINDINGS: Intrauterine gestational sac: Single

Yolk sac:  Not Visualized.

Embryo:  Visualized.

Cardiac Activity: Visualized.

Heart Rate: 169 bpm

CRL:   26 mm   9 w 3 d                  US EDC: 09/29/2018

Subchorionic hemorrhage:  None visualized.

Maternal uterus/adnexae: Both ovaries are normal in appearance. No
mass or abnormal free fluid identified.
IMPRESSION: Single living IUP measuring 9 weeks 3 days, with US EDC of
09/29/2018.

## 2019-07-25 DIAGNOSIS — H5213 Myopia, bilateral: Secondary | ICD-10-CM | POA: Diagnosis not present

## 2019-08-20 ENCOUNTER — Ambulatory Visit
Admission: EM | Admit: 2019-08-20 | Discharge: 2019-08-20 | Disposition: A | Discharge status: Home / Self Care | Payer: Medicaid Other

## 2019-08-20 ENCOUNTER — Other Ambulatory Visit: Payer: Self-pay

## 2019-08-20 DIAGNOSIS — L988 Other specified disorders of the skin and subcutaneous tissue: Secondary | ICD-10-CM | POA: Diagnosis not present

## 2019-08-20 DIAGNOSIS — M545 Low back pain: Secondary | ICD-10-CM | POA: Diagnosis not present

## 2019-08-20 NOTE — ED Triage Notes (Signed)
Pt presents with hand irritation and potential abscess. Right is worse than left. Pt states that this happens frequently

## 2019-08-20 NOTE — ED Notes (Signed)
Pt was just seen at urgent care on [address] for skin infection on back, was told the couldn't see her for hands. Provider made aware and prescription given by other urgent care will cover infection, advised to return if no improvement but otherwise no need for provider visit

## 2019-08-22 DIAGNOSIS — H5213 Myopia, bilateral: Secondary | ICD-10-CM | POA: Diagnosis not present

## 2019-12-20 ENCOUNTER — Ambulatory Visit
Admission: EM | Admit: 2019-12-20 | Discharge: 2019-12-20 | Disposition: A | Discharge status: Home / Self Care | Payer: Medicaid Other | Attending: Emergency Medicine | Admitting: Emergency Medicine

## 2019-12-20 ENCOUNTER — Other Ambulatory Visit: Payer: Self-pay

## 2019-12-20 ENCOUNTER — Encounter: Payer: Self-pay | Admitting: Emergency Medicine

## 2019-12-20 DIAGNOSIS — M79644 Pain in right finger(s): Secondary | ICD-10-CM | POA: Diagnosis not present

## 2019-12-20 MED ORDER — PREDNISONE 10 MG PO TABS: 20.0000 mg | ORAL_TABLET | Freq: Every day | ORAL | 0 refills | Status: DC

## 2019-12-20 MED ORDER — ACETAMINOPHEN 500 MG PO TABS: 500.0000 mg | ORAL_TABLET | Freq: Four times a day (QID) | ORAL | 0 refills | Status: AC | PRN

## 2019-12-20 NOTE — ED Triage Notes (Signed)
Pain to RT thumb since last night.  Denies any injury

## 2019-12-20 NOTE — Discharge Instructions (Addendum)
Prednisone was prescribed for inflammation take as directed Tylenol as prescribed for pain Follow RICE instruction that is attached Follow-up with PCP Return or go to ED for worsening symptoms

## 2019-12-20 NOTE — ED Provider Notes (Signed)
[facility] CARE CENTER   [id number] 12/20/19 Arrival Time: 1238   Chief Complaint  Patient presents with  . Hand Pain     SUBJECTIVE: History from: patient.  Brandy Chang is a 24 y.o. female who presents to the urgent care for complaint of right thumb pain since last night.  Denies any precipitating event, injury or trauma.  Has tried OTC medication without relief.  Describes the pain as constant achy, rated at 5 on a scale 1-10.  Symptoms are made worse with range of motion..   Denies fever, chills,, nausea, vomiting, diarrhea. .  ROS: As per HPI.  All other pertinent ROS negative.      Past Medical History:  Diagnosis Date  . Asthma   . Back pain    Past Surgical History:  Procedure Laterality Date  . WISDOM TOOTH EXTRACTION     No Known Allergies No current facility-administered medications on file prior to encounter.   Current Outpatient Medications on File Prior to Encounter  Medication Sig Dispense Refill  . ferrous sulfate 325 (65 FE) MG EC tablet Take 325 mg by mouth daily.    . naproxen (NAPROSYN) 500 MG tablet Take 1 tablet (500 mg total) by mouth 2 (two) times daily with a meal. 30 tablet 0   Social History   Socioeconomic History  . Marital status: Single    Spouse name: Not on file  . Number of children: 1  . Years of education: Not on file  . Highest education level: Not on file  Occupational History  . Not on file  Tobacco Use  . Smoking status: Current Some Day Smoker    Packs/day: 0.25    Years: 3.00    Pack years: 0.75    Types: Cigars  . Smokeless tobacco: Never Used  . Tobacco comment: smokes 1-2 daily  Vaping Use  . Vaping Use: Never used  Substance and Sexual Activity  . Alcohol use: No  . Drug use: No    Types: Marijuana    Comment: denies use 01/12/19  . Sexual activity: Yes    Birth control/protection: None  Other Topics Concern  . Not on file  Social History Narrative  . Not on file   Social Determinants of Health    Financial Resource Strain:   . Difficulty of Paying Living Expenses:   Food Insecurity:   . Worried About Programme researcher, broadcasting/film/video in the Last Year:   . Barista in the Last Year:   Transportation Needs:   . Freight forwarder (Medical):   Marland Kitchen Lack of Transportation (Non-Medical):   Physical Activity:   . Days of Exercise per Week:   . Minutes of Exercise per Session:   Stress:   . Feeling of Stress :   Social Connections:   . Frequency of Communication with Friends and Family:   . Frequency of Social Gatherings with Friends and Family:   . Attends Religious Services:   . Active Member of Clubs or Organizations:   . Attends Banker Meetings:   Marland Kitchen Marital Status:   Intimate Partner Violence:   . Fear of Current or Ex-Partner:   . Emotionally Abused:   Marland Kitchen Physically Abused:   . Sexually Abused:    Family History  Problem Relation Age of Onset  . Anemia Mother   . Diabetes Other   . Hypertension Other   . Stroke Other   . Osteoarthritis Other   . Sickle cell anemia Other   .  Heart attack Paternal Grandfather   . COPD Maternal Grandmother   . Diabetes Maternal Grandfather   . Sickle cell trait Maternal Grandfather     OBJECTIVE:  Vitals:   12/20/19 1323  BP: 130/83  Pulse: 70  Resp: 16  Temp: 98.7 F (37.1 C)  TempSrc: Oral  SpO2: 98%     Physical Exam Vitals and nursing note reviewed.  Constitutional:      General: She is not in acute distress.    Appearance: Normal appearance. She is normal weight. She is not ill-appearing, toxic-appearing or diaphoretic.  Cardiovascular:     Rate and Rhythm: Regular rhythm.     Pulses: Normal pulses.     Heart sounds: Normal heart sounds. No murmur heard.  No friction rub. No gallop.   Pulmonary:     Effort: Pulmonary effort is normal. No respiratory distress.     Breath sounds: Normal breath sounds. No stridor. No wheezing, rhonchi or rales.  Chest:     Chest wall: No tenderness.  Musculoskeletal:         General: Tenderness present.     Right hand: Tenderness present.     Left hand: Normal.     Comments: The right hand is without obvious asymmetry or deformity when compared to the left hand.  No swelling, erythema, or obvious deformity, surface trauma, open wound, nail avulsion, tissue avulsion, partial or complete amputation, subungual hematoma, bleeding deformity present.  Normal cascade of finger.  Limited range of motion on right thumb.  Pulse and capillary refill intact normal  Neurological:     Mental Status: She is alert.     LABS:  No results found for this or any previous visit (from the past 24 hour(s)).   ASSESSMENT & PLAN:  1. Pain of right thumb     Meds ordered this encounter  Medications  . predniSONE (DELTASONE) 10 MG tablet    Sig: Take 2 tablets (20 mg total) by mouth daily.    Dispense:  15 tablet    Refill:  0  . acetaminophen (TYLENOL) 500 MG tablet    Sig: Take 1 tablet (500 mg total) by mouth every 6 (six) hours as needed.    Dispense:  30 tablet    Refill:  0    Discharge Instructions.  Prednisone was prescribed for inflammation take as directed Tylenol as prescribed for pain Follow RICE instruction that is attached Follow-up with PCP Return or go to ED for worsening symptoms  Reviewed expectations re: course of current medical issues. Questions answered. Outlined signs and symptoms indicating need for more acute intervention. Patient verbalized understanding. After Visit Summary given.      Note: This document was prepared using Dragon voice recognition software and may include unintentional dictation errors.    Durward Parcel, FNP 12/20/19 1426

## 2019-12-23 ENCOUNTER — Ambulatory Visit
Admission: EM | Admit: 2019-12-23 | Discharge: 2019-12-23 | Disposition: A | Discharge status: Home / Self Care | Payer: Medicaid Other | Attending: Emergency Medicine | Admitting: Emergency Medicine

## 2019-12-23 ENCOUNTER — Other Ambulatory Visit: Payer: Self-pay

## 2019-12-23 ENCOUNTER — Encounter: Payer: Self-pay | Admitting: Emergency Medicine

## 2019-12-23 DIAGNOSIS — M545 Low back pain, unspecified: Secondary | ICD-10-CM

## 2019-12-23 DIAGNOSIS — Z0289 Encounter for other administrative examinations: Secondary | ICD-10-CM

## 2019-12-23 MED ORDER — CYCLOBENZAPRINE HCL 10 MG PO TABS: 10.0000 mg | ORAL_TABLET | Freq: Every day | ORAL | 0 refills | Status: DC

## 2019-12-23 NOTE — ED Provider Notes (Signed)
[facility] CARE CENTER   [id number] 12/23/19 Arrival Time: 1419  CC: Back PAIN  SUBJECTIVE: History from: patient. Brandy Chang is a 24 y.o. female complains of acute on chronic LT low back pain x few days.  Does admit to lifting furniture.  Localizes the pain to the LT low back.  Describes the pain as intermittent and sharp in character.  Has tried OTC medications without relief.  Symptoms are made worse with movement.  Denies similar symptoms in the past.  Denies fever, chills, erythema, ecchymosis, effusion, weakness, numbness and tingling, saddle paresthesias, loss of bowel or bladder function.     Also requests work note.     ROS: As per HPI.  All other pertinent ROS negative.     Past Medical History:  Diagnosis Date   Asthma    Back pain    Past Surgical History:  Procedure Laterality Date   WISDOM TOOTH EXTRACTION     No Known Allergies No current facility-administered medications on file prior to encounter.   Current Outpatient Medications on File Prior to Encounter  Medication Sig Dispense Refill   acetaminophen (TYLENOL) 500 MG tablet Take 1 tablet (500 mg total) by mouth every 6 (six) hours as needed. 30 tablet 0   ferrous sulfate 325 (65 FE) MG EC tablet Take 325 mg by mouth daily.     naproxen (NAPROSYN) 500 MG tablet Take 1 tablet (500 mg total) by mouth 2 (two) times daily with a meal. 30 tablet 0   predniSONE (DELTASONE) 10 MG tablet Take 2 tablets (20 mg total) by mouth daily. 15 tablet 0   Social History   Socioeconomic History   Marital status: Single    Spouse name: Not on file   Number of children: 1   Years of education: Not on file   Highest education level: Not on file  Occupational History   Not on file  Tobacco Use   Smoking status: Current Some Day Smoker    Packs/day: 0.25    Years: 3.00    Pack years: 0.75    Types: Cigars   Smokeless tobacco: Never Used   Tobacco comment: smokes 1-2 daily  Vaping Use   Vaping Use:  Never used  Substance and Sexual Activity   Alcohol use: No   Drug use: No    Types: Marijuana    Comment: denies use 01/12/19   Sexual activity: Yes    Birth control/protection: None  Other Topics Concern   Not on file  Social History Narrative   Not on file   Social Determinants of Health   Financial Resource Strain:    Difficulty of Paying Living Expenses:   Food Insecurity:    Worried About Programme researcher, broadcasting/film/video in the Last Year:    Barista in the Last Year:   Transportation Needs:    Freight forwarder (Medical):    Lack of Transportation (Non-Medical):   Physical Activity:    Days of Exercise per Week:    Minutes of Exercise per Session:   Stress:    Feeling of Stress :   Social Connections:    Frequency of Communication with Friends and Family:    Frequency of Social Gatherings with Friends and Family:    Attends Religious Services:    Active Member of Clubs or Organizations:    Attends Banker Meetings:    Marital Status:   Intimate Partner Violence:    Fear of Current or Ex-Partner:  Emotionally Abused:    Physically Abused:    Sexually Abused:    Family History  Problem Relation Age of Onset   Anemia Mother    Diabetes Other    Hypertension Other    Stroke Other    Osteoarthritis Other    Sickle cell anemia Other    Heart attack Paternal Grandfather    COPD Maternal Grandmother    Diabetes Maternal Grandfather    Sickle cell trait Maternal Grandfather     OBJECTIVE:  Vitals:   12/23/19 1439 12/23/19 1441  BP:  112/71  Pulse:  85  Resp:  17  Temp:  98.9 F (37.2 C)  TempSrc:  Oral  SpO2:  98%  Weight: 158 lb 11.7 oz (72 kg)   Height: 5\' 8"  (1.727 m)     General appearance: ALERT; in no acute distress.  Head: NCAT Lungs: Normal respiratory effort; CTAB CV: RRR Musculoskeletal: Back Inspection: Skin warm, dry, clear and intact without obvious erythema, effusion, or ecchymosis.    Palpation: diffusely TTP over lumbar spine and LT low back ROM: FROM active and passive Strength: 5/5 shld abduction, 5/5 shld adduction, 5/5 elbow flexion, 5/5 elbow extension, 5/5 grip strength, 5/5 hip flexion, 5/5 hip extension Skin: warm and dry Neurologic: Ambulates without difficulty; Sensation intact about the upper/ lower extremities Psychological: alert and cooperative; normal mood and affect  ASSESSMENT & PLAN:  1. Acute left-sided low back pain without sciatica     Meds ordered this encounter  Medications   cyclobenzaprine (FLEXERIL) 10 MG tablet    Sig: Take 1 tablet (10 mg total) by mouth at bedtime.    Dispense:  10 tablet    Refill:  0    Order Specific Question:   Supervising Provider    Answer:   Eustace Moore    Continue conservative management of rest, ice, and gentle stretches Continue with prednisone Take cyclobenzaprine at nighttime for symptomatic relief. Avoid driving or operating heavy machinery while using medication. Follow up with PCP or orthopedist for chronic back pain Return or go to the ER if you have any new or worsening symptoms (fever, chills, chest pain, abdominal pain, changes in bowel or bladder habits, pain radiating into lower legs, etc...)   Reviewed expectations re: course of current medical issues. Questions answered. Outlined signs and symptoms indicating need for more acute intervention. Patient verbalized understanding. After Visit Summary given.    [[id number]], PA-C 12/23/19 1514

## 2019-12-23 NOTE — Discharge Instructions (Signed)
Continue conservative management of rest, ice, and gentle stretches Continue with prednisone Take cyclobenzaprine at nighttime for symptomatic relief. Avoid driving or operating heavy machinery while using medication. Follow up with PCP or orthopedist for chronic back pain Return or go to the ER if you have any new or worsening symptoms (fever, chills, chest pain, abdominal pain, changes in bowel or bladder habits, pain radiating into lower legs, etc...)

## 2019-12-23 NOTE — ED Triage Notes (Addendum)
Pain on and off since pt was 24 years old.  States she has been moving into a new home and doing some lifting . Reports the pain seems to come on when she gets her menstrual cycle.

## 2020-08-13 ENCOUNTER — Telehealth: Payer: Self-pay | Admitting: Emergency Medicine

## 2020-08-13 ENCOUNTER — Ambulatory Visit
Admission: EM | Admit: 2020-08-13 | Discharge: 2020-08-13 | Disposition: A | Discharge status: Home / Self Care | Payer: Medicaid Other | Attending: Physician Assistant | Admitting: Physician Assistant

## 2020-08-13 ENCOUNTER — Other Ambulatory Visit: Payer: Self-pay

## 2020-08-13 DIAGNOSIS — L03319 Cellulitis of trunk, unspecified: Secondary | ICD-10-CM

## 2020-08-13 DIAGNOSIS — L02219 Cutaneous abscess of trunk, unspecified: Secondary | ICD-10-CM | POA: Diagnosis not present

## 2020-08-13 MED ORDER — SULFAMETHOXAZOLE-TRIMETHOPRIM 800-160 MG PO TABS: 1.0000 | ORAL_TABLET | Freq: Two times a day (BID) | ORAL | 0 refills | Status: AC

## 2020-08-13 MED ORDER — SULFAMETHOXAZOLE-TRIMETHOPRIM 800-160 MG PO TABS: 1.0000 | ORAL_TABLET | Freq: Two times a day (BID) | ORAL | Status: DC

## 2020-08-13 NOTE — ED Triage Notes (Signed)
abscess on right side of bottom.  Noticed it 4 days ago.

## 2020-08-13 NOTE — Discharge Instructions (Signed)
Soak area 20 minutes 4 times a day °

## 2020-08-13 NOTE — ED Provider Notes (Signed)
[facility]    CSN: [id number] Arrival date & time: 08/13/20  1054      History   Chief Complaint No chief complaint on file.   HPI Brandy Chang is a 25 y.o. female.   The history is provided by the patient. No language interpreter was used.  Abscess Location:  Pelvis Pelvic abscess location:  L buttock Size:  6 Abscess quality: fluctuance, painful, redness and warmth   Red streaking: no   Duration:  3 days Progression:  Worsening Pain details:    Quality:  Aching   Severity:  Moderate   Duration:  3 days Chronicity:  New Relieved by:  Nothing Worsened by:  Nothing Associated symptoms: no fever     Past Medical History:  Diagnosis Date  . Asthma   . Back pain     Patient Active Problem List   Diagnosis Date Noted  . Preterm delivery 09/05/2018  . Abnormal Pap smear of cervix 06/11/2018  . Smoker 03/27/2018  . Vulvar abscess 02/15/2016    Past Surgical History:  Procedure Laterality Date  . WISDOM TOOTH EXTRACTION      OB History    Gravida  1   Para  1   Term  0   Preterm  1   AB  0   Living  1     SAB  0   IAB  0   Ectopic  0   Multiple  0   Live Births  1            Home Medications    Prior to Admission medications   Medication Sig Start Date End Date Taking? Authorizing Provider  acetaminophen (TYLENOL) 500 MG tablet Take 1 tablet (500 mg total) by mouth every 6 (six) hours as needed. 12/20/19   AvegnoZachery Dakins, FNP    Family History Family History  Problem Relation Age of Onset  . Anemia Mother   . Diabetes Other   . Hypertension Other   . Stroke Other   . Osteoarthritis Other   . Sickle cell anemia Other   . Heart attack Paternal Grandfather   . COPD Maternal Grandmother   . Diabetes Maternal Grandfather   . Sickle cell trait Maternal Grandfather     Social History Social History   Tobacco Use  . Smoking status: Current Some Day Smoker    Packs/day: 0.25    Years: 3.00    Pack  years: 0.75    Types: Cigars  . Smokeless tobacco: Never Used  . Tobacco comment: smokes 1-2 daily  Vaping Use  . Vaping Use: Never used  Substance Use Topics  . Alcohol use: No  . Drug use: No    Types: Marijuana    Comment: denies use 01/12/19     Allergies   Patient has no known allergies.   Review of Systems Review of Systems  Constitutional: Negative for fever.  All other systems reviewed and are negative.    Physical Exam Triage Vital Signs ED Triage Vitals  Enc Vitals Group     BP 08/13/20 1110 120/67     Pulse Rate 08/13/20 1110 (!) 102     Resp 08/13/20 1110 18     Temp 08/13/20 1110 98.6 F (37 C)     Temp Source 08/13/20 1110 Oral     SpO2 08/13/20 1110 96 %     Weight --      Height --      Head Circumference --  Peak Flow --      Pain Score 08/13/20 1112 3     Pain Loc --      Pain Edu? --      Excl. in GC? --    No data found.  Updated Vital Signs BP 120/67 (BP Location: Right Arm)   Pulse (!) 102   Temp 98.6 F (37 C) (Oral)   Resp 18   LMP 07/05/2020   SpO2 96%   Visual Acuity Right Eye Distance:   Left Eye Distance:   Bilateral Distance:    Right Eye Near:   Left Eye Near:    Bilateral Near:     Physical Exam Vitals and nursing note reviewed.  Constitutional:      Appearance: She is well-developed and well-nourished.  HENT:     Head: Normocephalic.  Eyes:     Extraocular Movements: EOM normal.  Pulmonary:     Effort: Pulmonary effort is normal.  Abdominal:     General: There is no distension.  Genitourinary:    Comments: 6cm swollen area right buttock,   Musculoskeletal:        General: Normal range of motion.     Cervical back: Normal range of motion.  Neurological:     Mental Status: She is alert and oriented to person, place, and time.  Psychiatric:        Mood and Affect: Mood and affect and mood normal.      UC Treatments / Results  Labs (all labs ordered are listed, but only abnormal results are  displayed) Labs Reviewed - No data to display  EKG   Radiology No results found.  Procedures Incision and Drainage  Date/Time: 08/13/2020 1:46 PM Performed by: Elson Areas, PA-C Authorized by: Elson Areas, PA-C   Consent:    Consent obtained:  Verbal   Consent given by:  Patient   Risks, benefits, and alternatives were discussed: yes     Risks discussed:  Bleeding and incomplete drainage   Alternatives discussed:  No treatment Universal protocol:    Procedure explained and questions answered to patient or proxy's satisfaction: yes     Patient identity confirmed:  Verbally with patient Location:    Type:  Abscess   Size:  6   Location:  Lower extremity   Lower extremity location:  Buttock   Buttock location:  R buttock Pre-procedure details:    Skin preparation:  Povidone-iodine Sedation:    Sedation type:  None Anesthesia:    Anesthesia method:  Local infiltration   Local anesthetic:  Lidocaine 1% w/o epi Procedure type:    Complexity:  Simple Procedure details:    Incision types:  Stab incision   Wound management:  Probed and deloculated   Drainage:  Purulent   Wound treatment:  Wound left open Post-procedure details:    Procedure completion:  Tolerated with difficulty Comments:     Very small incision.  Pt difficult to numb. Pt jerks away.    (including critical care time)  Medications Ordered in UC Medications  sulfamethoxazole-trimethoprim (BACTRIM DS) 800-160 MG per tablet 1 tablet (has no administration in time range)    Initial Impression / Assessment and Plan / UC Course  I have reviewed the triage vital signs and the nursing notes.  Pertinent labs & imaging results that were available during my care of the patient were reviewed by me and considered in my medical decision making (see chart for details).     MDM Pt given  rx for antibiotics, advised to soak area.  Final Clinical Impressions(s) / UC Diagnoses   Final diagnoses:   Cellulitis and abscess of trunk     Discharge Instructions     Soak area 20 minutes 4 times a day    ED Prescriptions    None     PDMP not reviewed this encounter.  An After Visit Summary was printed and given to the patient.    Elson Areas, [place] 08/13/20 1349

## 2020-10-07 DIAGNOSIS — L729 Follicular cyst of the skin and subcutaneous tissue, unspecified: Secondary | ICD-10-CM | POA: Diagnosis not present

## 2020-10-07 DIAGNOSIS — R1900 Intra-abdominal and pelvic swelling, mass and lump, unspecified site: Secondary | ICD-10-CM | POA: Diagnosis not present

## 2020-10-07 DIAGNOSIS — L0501 Pilonidal cyst with abscess: Secondary | ICD-10-CM | POA: Diagnosis not present

## 2020-10-07 DIAGNOSIS — F17200 Nicotine dependence, unspecified, uncomplicated: Secondary | ICD-10-CM | POA: Diagnosis not present

## 2020-10-07 DIAGNOSIS — L989 Disorder of the skin and subcutaneous tissue, unspecified: Secondary | ICD-10-CM | POA: Diagnosis not present

## 2020-10-07 DIAGNOSIS — L0591 Pilonidal cyst without abscess: Secondary | ICD-10-CM | POA: Diagnosis not present

## 2020-10-07 DIAGNOSIS — R109 Unspecified abdominal pain: Secondary | ICD-10-CM | POA: Diagnosis not present

## 2020-10-09 DIAGNOSIS — Z4800 Encounter for change or removal of nonsurgical wound dressing: Secondary | ICD-10-CM | POA: Diagnosis not present

## 2020-12-05 DIAGNOSIS — F17210 Nicotine dependence, cigarettes, uncomplicated: Secondary | ICD-10-CM | POA: Diagnosis not present

## 2020-12-05 DIAGNOSIS — R002 Palpitations: Secondary | ICD-10-CM | POA: Diagnosis not present

## 2020-12-05 DIAGNOSIS — R000 Tachycardia, unspecified: Secondary | ICD-10-CM | POA: Diagnosis not present

## 2020-12-05 DIAGNOSIS — Z20822 Contact with and (suspected) exposure to covid-19: Secondary | ICD-10-CM | POA: Diagnosis not present

## 2020-12-09 DIAGNOSIS — R5383 Other fatigue: Secondary | ICD-10-CM | POA: Diagnosis not present

## 2020-12-09 DIAGNOSIS — R002 Palpitations: Secondary | ICD-10-CM | POA: Diagnosis not present

## 2020-12-09 DIAGNOSIS — Z7251 High risk heterosexual behavior: Secondary | ICD-10-CM | POA: Diagnosis not present

## 2020-12-09 DIAGNOSIS — Z0000 Encounter for general adult medical examination without abnormal findings: Secondary | ICD-10-CM | POA: Diagnosis not present

## 2020-12-09 DIAGNOSIS — E559 Vitamin D deficiency, unspecified: Secondary | ICD-10-CM | POA: Diagnosis not present

## 2020-12-15 DIAGNOSIS — R002 Palpitations: Secondary | ICD-10-CM | POA: Diagnosis not present

## 2021-01-27 DIAGNOSIS — L0231 Cutaneous abscess of buttock: Secondary | ICD-10-CM | POA: Diagnosis not present

## 2021-01-27 DIAGNOSIS — L739 Follicular disorder, unspecified: Secondary | ICD-10-CM | POA: Diagnosis not present

## 2021-03-19 DIAGNOSIS — M5416 Radiculopathy, lumbar region: Secondary | ICD-10-CM | POA: Diagnosis not present

## 2021-03-19 DIAGNOSIS — M545 Low back pain, unspecified: Secondary | ICD-10-CM | POA: Diagnosis not present
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
## 2021-07-05 DIAGNOSIS — N3001 Acute cystitis with hematuria: Secondary | ICD-10-CM | POA: Diagnosis not present

## 2021-07-05 DIAGNOSIS — R300 Dysuria: Secondary | ICD-10-CM | POA: Diagnosis not present

## 2021-07-18 DIAGNOSIS — A549 Gonococcal infection, unspecified: Secondary | ICD-10-CM | POA: Diagnosis not present

## 2021-07-18 DIAGNOSIS — M79644 Pain in right finger(s): Secondary | ICD-10-CM | POA: Diagnosis not present

## 2021-08-01 DIAGNOSIS — H5213 Myopia, bilateral: Secondary | ICD-10-CM | POA: Diagnosis not present

## 2021-08-03 DIAGNOSIS — N760 Acute vaginitis: Secondary | ICD-10-CM | POA: Diagnosis not present

## 2021-08-03 DIAGNOSIS — A64 Unspecified sexually transmitted disease: Secondary | ICD-10-CM | POA: Diagnosis not present

## 2021-08-24 DIAGNOSIS — Z0001 Encounter for general adult medical examination with abnormal findings: Secondary | ICD-10-CM | POA: Diagnosis not present

## 2021-08-24 DIAGNOSIS — Z01419 Encounter for gynecological examination (general) (routine) without abnormal findings: Secondary | ICD-10-CM | POA: Diagnosis not present

## 2021-08-24 DIAGNOSIS — Z113 Encounter for screening for infections with a predominantly sexual mode of transmission: Secondary | ICD-10-CM | POA: Diagnosis not present

## 2021-08-24 DIAGNOSIS — N764 Abscess of vulva: Secondary | ICD-10-CM | POA: Diagnosis not present

## 2021-08-31 DIAGNOSIS — Z113 Encounter for screening for infections with a predominantly sexual mode of transmission: Secondary | ICD-10-CM | POA: Diagnosis not present

## 2021-08-31 DIAGNOSIS — N764 Abscess of vulva: Secondary | ICD-10-CM | POA: Diagnosis not present

## 2021-08-31 DIAGNOSIS — Z202 Contact with and (suspected) exposure to infections with a predominantly sexual mode of transmission: Secondary | ICD-10-CM | POA: Diagnosis not present

## 2021-09-15 DIAGNOSIS — K529 Noninfective gastroenteritis and colitis, unspecified: Secondary | ICD-10-CM | POA: Diagnosis not present

## 2021-09-15 DIAGNOSIS — R110 Nausea: Secondary | ICD-10-CM | POA: Diagnosis not present

## 2022-01-17 DIAGNOSIS — S39012A Strain of muscle, fascia and tendon of lower back, initial encounter: Secondary | ICD-10-CM | POA: Diagnosis not present

## 2022-03-01 DIAGNOSIS — N898 Other specified noninflammatory disorders of vagina: Secondary | ICD-10-CM | POA: Diagnosis not present

## 2022-03-01 DIAGNOSIS — N9089 Other specified noninflammatory disorders of vulva and perineum: Secondary | ICD-10-CM | POA: Diagnosis not present

## 2022-03-05 DIAGNOSIS — L0501 Pilonidal cyst with abscess: Secondary | ICD-10-CM | POA: Diagnosis not present

## 2022-06-03 DIAGNOSIS — R070 Pain in throat: Secondary | ICD-10-CM | POA: Diagnosis not present

## 2022-07-07 DIAGNOSIS — K047 Periapical abscess without sinus: Secondary | ICD-10-CM | POA: Diagnosis not present

## 2022-07-07 DIAGNOSIS — M272 Inflammatory conditions of jaws: Secondary | ICD-10-CM | POA: Diagnosis not present

## 2022-10-16 DIAGNOSIS — F17290 Nicotine dependence, other tobacco product, uncomplicated: Secondary | ICD-10-CM | POA: Diagnosis not present

## 2022-10-16 DIAGNOSIS — L0591 Pilonidal cyst without abscess: Secondary | ICD-10-CM | POA: Diagnosis not present

## 2022-12-19 DIAGNOSIS — M545 Low back pain, unspecified: Secondary | ICD-10-CM | POA: Diagnosis not present

## 2023-02-06 DIAGNOSIS — N898 Other specified noninflammatory disorders of vagina: Secondary | ICD-10-CM | POA: Diagnosis not present

## 2023-02-06 DIAGNOSIS — R309 Painful micturition, unspecified: Secondary | ICD-10-CM | POA: Diagnosis not present

## 2023-02-06 DIAGNOSIS — N390 Urinary tract infection, site not specified: Secondary | ICD-10-CM | POA: Diagnosis not present

## 2023-02-06 DIAGNOSIS — A599 Trichomoniasis, unspecified: Secondary | ICD-10-CM | POA: Diagnosis not present

## 2023-02-06 DIAGNOSIS — R350 Frequency of micturition: Secondary | ICD-10-CM | POA: Diagnosis not present

## 2023-02-15 DIAGNOSIS — R309 Painful micturition, unspecified: Secondary | ICD-10-CM | POA: Diagnosis not present

## 2023-02-15 DIAGNOSIS — R350 Frequency of micturition: Secondary | ICD-10-CM | POA: Diagnosis not present

## 2023-02-15 DIAGNOSIS — A599 Trichomoniasis, unspecified: Secondary | ICD-10-CM | POA: Diagnosis not present

## 2023-02-15 DIAGNOSIS — N390 Urinary tract infection, site not specified: Secondary | ICD-10-CM | POA: Diagnosis not present

## 2023-02-15 DIAGNOSIS — N898 Other specified noninflammatory disorders of vagina: Secondary | ICD-10-CM | POA: Diagnosis not present

## 2023-07-17 DIAGNOSIS — L0231 Cutaneous abscess of buttock: Secondary | ICD-10-CM | POA: Diagnosis not present

## 2023-07-17 DIAGNOSIS — Z87891 Personal history of nicotine dependence: Secondary | ICD-10-CM | POA: Diagnosis not present

## 2023-07-19 DIAGNOSIS — Z09 Encounter for follow-up examination after completed treatment for conditions other than malignant neoplasm: Secondary | ICD-10-CM | POA: Diagnosis not present

## 2023-07-19 DIAGNOSIS — L0501 Pilonidal cyst with abscess: Secondary | ICD-10-CM | POA: Diagnosis not present

## 2023-07-26 ENCOUNTER — Encounter: Payer: Self-pay | Admitting: *Deleted

## 2023-08-21 ENCOUNTER — Other Ambulatory Visit: Payer: Self-pay | Admitting: *Deleted

## 2023-08-21 DIAGNOSIS — L0591 Pilonidal cyst without abscess: Secondary | ICD-10-CM

## 2023-08-24 ENCOUNTER — Ambulatory Visit: Payer: Medicaid Other | Admitting: Surgery

## 2023-08-24 ENCOUNTER — Encounter: Payer: Self-pay | Admitting: Surgery

## 2023-08-24 VITALS — BP 117/75 | HR 73 | Temp 98.1°F | Resp 12 | Ht 68.0 in | Wt 189.0 lb

## 2023-08-24 DIAGNOSIS — L0591 Pilonidal cyst without abscess: Secondary | ICD-10-CM

## 2023-08-24 NOTE — Progress Notes (Signed)
 [facility] History and Physical  Reason for Referral: Pilonidal cyst Referring Physician: Carmel Sacramento, NP  Chief Complaint   New Patient (Initial Visit)     Brandy Chang is a 28 y.o. female.  HPI: Patient presents for evaluation of a pilonidal cyst.  She has had it for 2 years, and is required multiple incision and drainages of the area.  The most recent incision and drainage was 1 month ago and it has healed since that time.  She was also given antibiotics at her most recent incision and drainage.  She denies any other significant lumps or bumps.  She has had a history of groin abscesses in the past which have been I&D, but those have not caused any problems for her in over 5 years.  She denies any significant past medical history.  Her surgical history significant for C-section.  She denies use of blood thinning medications.  She uses a vape and goes through a cartridge about once every 2 weeks.  She will occasionally drink alcohol and occasionally smoke marijuana.  She denies any other illicit drug use.  Past Medical History:  Diagnosis Date   Asthma    Back pain     Past Surgical History:  Procedure Laterality Date   WISDOM TOOTH EXTRACTION      Family History  Problem Relation Age of Onset   Anemia Mother    Diabetes Other    Hypertension Other    Stroke Other    Osteoarthritis Other    Sickle cell anemia Other    Heart attack Paternal Grandfather    COPD Maternal Grandmother    Diabetes Maternal Grandfather    Sickle cell trait Maternal Grandfather     Social History   Tobacco Use   Smoking status: Some Days    Current packs/day: 0.25    Average packs/day: 0.3 packs/day for 3.0 years (0.8 ttl pk-yrs)    Types: Cigars, Cigarettes   Smokeless tobacco: Never   Tobacco comments:    smokes 1-2 daily  Vaping Use   Vaping status: Never Used  Substance Use Topics   Alcohol use: No   Drug use: No    Types: Marijuana    Comment: denies use  01/12/19    Medications: I have reviewed the patient's current medications. Allergies as of 08/24/2023   No Known Allergies      Medication List        Accurate as of August 24, 2023  9:07 AM. If you have any questions, ask your nurse or doctor.          acetaminophen 500 MG tablet Commonly known as: TYLENOL Take 1 tablet (500 mg total) by mouth every 6 (six) hours as needed.         ROS:  Constitutional: negative for chills, fatigue, and fevers Eyes: negative for visual disturbance and pain Ears, nose, mouth, throat, and face: negative for ear drainage, sore throat, and sinus problems Respiratory: negative for cough, wheezing, and shortness of breath Cardiovascular: negative for chest pain and palpitations Gastrointestinal: negative for abdominal pain, nausea, reflux symptoms, and vomiting Genitourinary:negative for dysuria and frequency Integument/breast: negative for dryness and rash Hematologic/lymphatic: negative for bleeding and lymphadenopathy Musculoskeletal:negative for back pain and neck pain Neurological: negative for dizziness and tremors Endocrine: negative for temperature intolerance  Blood pressure 117/75, pulse 73, temperature 98.1 F (36.7 C), temperature source Oral, resp. rate 12, height 5\' 8"  (1.727 m), weight 189 lb (85.7 kg), SpO2 97%. Physical Exam  Vitals reviewed.  Constitutional:      Appearance: Normal appearance.  HENT:     Head: Normocephalic and atraumatic.  Eyes:     Extraocular Movements: Extraocular movements intact.     Pupils: Pupils are equal, round, and reactive to light.  Cardiovascular:     Rate and Rhythm: Normal rate and regular rhythm.  Pulmonary:     Effort: Pulmonary effort is normal.     Breath sounds: Normal breath sounds.  Abdominal:     General: There is no distension.     Palpations: Abdomen is soft.     Tenderness: There is no abdominal tenderness.  Musculoskeletal:        General: Normal range of motion.      Cervical back: Normal range of motion.  Skin:    General: Skin is warm and dry.     Comments: 1 to 2 cm pilonidal cyst in the midline just superior to the gluteal cleft, nontender to palpation, no significant erythema or induration to suggest infection  Neurological:     General: No focal deficit present.     Mental Status: She is alert and oriented to person, place, and time.  Psychiatric:        Mood and Affect: Mood normal.        Behavior: Behavior normal.     Results: No results found for this or any previous visit (from the past 48 hours).  No results found.   Assessment & Plan:  Brandy Chang is a 28 y.o. female who presents for evaluation of a pilonidal cyst.  -We discussed that pilonidal cysts/ sinuses are abnormal tissue under the skin that is prone to infections. It is more common in patients that are overweight, family history of a pilonidal cyst, deep gluteal cleft and thicker body hair, especially in the region of the gluteal cleft. We discussed that the exact cause of the disease is unknown but could be related to ingrown hairs/ and inflammation in the area coupled with mechanical / shear forces. We discussed that these cysts and sinuses can be surgically removed but that they often recur.  Prior to surgery, options for management include keeping the area clean by removal of hair from the area to decrease bacterial load and prevent trapping of feces in the area, cleansing the area after bowel movements, and daily to twice daily showering as well as keeping the area dry and wicking away moisture from the area if needed with dry gauze replaced often.  Also discussed pulling hair from the pits as this is one cause of the sinus getting clogged and infected.   -The risk and benefits of pilonidal cyst excision were discussed including but not limited to bleeding, infection, injury to surrounding structures, need for additional procedures, cyst recurrence, poor wound  healing/postoperative wound.  She would like to think about her surgery and the timing of it. -Advised that she should call the office when she has decided when she would like to proceed with surgery -Information provided to the patient regarding pilonidal cysts -Advised that she needs to call our office or present to UC/ED if the area begins to become inflamed and infected again  All questions were answered to the satisfaction of the patient.  Note: Portions of this report may have been transcribed using voice recognition software. Every effort has been made to ensure accuracy; however, inadvertent computerized transcription errors may still be present.   Theophilus Kinds, DO [facility] [address] [place]  [place], [place] [id number] [phone] (office)

## 2023-08-30 DIAGNOSIS — F17210 Nicotine dependence, cigarettes, uncomplicated: Secondary | ICD-10-CM | POA: Diagnosis not present

## 2023-08-30 DIAGNOSIS — E559 Vitamin D deficiency, unspecified: Secondary | ICD-10-CM | POA: Diagnosis not present

## 2023-08-30 DIAGNOSIS — Z0000 Encounter for general adult medical examination without abnormal findings: Secondary | ICD-10-CM | POA: Diagnosis not present

## 2023-08-30 DIAGNOSIS — R0602 Shortness of breath: Secondary | ICD-10-CM | POA: Diagnosis not present

## 2023-10-07 DIAGNOSIS — H5213 Myopia, bilateral: Secondary | ICD-10-CM | POA: Diagnosis not present

## 2023-11-30 DIAGNOSIS — R202 Paresthesia of skin: Secondary | ICD-10-CM | POA: Diagnosis not present

## 2023-12-05 DIAGNOSIS — M5431 Sciatica, right side: Secondary | ICD-10-CM | POA: Diagnosis not present
